# Patient Record
Sex: Female | Born: 1960
Health system: Southern US, Community
[De-identification: ages and names within clinical notes are randomized; demographics above are authoritative.]

## PROBLEM LIST (undated history)

## (undated) DIAGNOSIS — S149XXA Injury of unspecified nerves of neck, initial encounter: Secondary | ICD-10-CM

## (undated) DIAGNOSIS — M7989 Other specified soft tissue disorders: Secondary | ICD-10-CM

## (undated) DIAGNOSIS — I341 Nonrheumatic mitral (valve) prolapse: Secondary | ICD-10-CM

## (undated) DIAGNOSIS — Z789 Other specified health status: Secondary | ICD-10-CM

## (undated) DIAGNOSIS — D649 Anemia, unspecified: Secondary | ICD-10-CM

## (undated) DIAGNOSIS — G589 Mononeuropathy, unspecified: Secondary | ICD-10-CM

## (undated) HISTORY — PX: PAROTID GLAND TUMOR EXCISION: SHX5221

## (undated) HISTORY — PX: UPPER GI ENDOSCOPY: SHX6162

## (undated) HISTORY — PX: CERVICAL POLYPECTOMY: SHX88

## (undated) HISTORY — PX: COLONOSCOPY: SHX174

## (undated) HISTORY — PX: WISDOM TOOTH EXTRACTION: SHX21

---

## 1998-08-06 ENCOUNTER — Ambulatory Visit (HOSPITAL_COMMUNITY): Admission: RE | Admit: 1998-08-06 | Discharge: 1998-08-06 | Payer: Self-pay | Admitting: Internal Medicine

## 1999-06-22 ENCOUNTER — Other Ambulatory Visit: Admission: RE | Admit: 1999-06-22 | Discharge: 1999-06-22 | Payer: Self-pay | Admitting: Obstetrics & Gynecology

## 2000-08-11 ENCOUNTER — Other Ambulatory Visit: Admission: RE | Admit: 2000-08-11 | Discharge: 2000-08-11 | Payer: Self-pay | Admitting: Obstetrics and Gynecology

## 2001-08-14 ENCOUNTER — Other Ambulatory Visit: Admission: RE | Admit: 2001-08-14 | Discharge: 2001-08-14 | Payer: Self-pay | Admitting: Obstetrics and Gynecology

## 2001-11-06 ENCOUNTER — Encounter: Admission: RE | Admit: 2001-11-06 | Discharge: 2001-11-06 | Payer: Self-pay | Admitting: Otolaryngology

## 2001-11-06 ENCOUNTER — Encounter: Payer: Self-pay | Admitting: Otolaryngology

## 2002-01-15 ENCOUNTER — Ambulatory Visit (HOSPITAL_BASED_OUTPATIENT_CLINIC_OR_DEPARTMENT_OTHER): Admission: RE | Admit: 2002-01-15 | Discharge: 2002-01-16 | Payer: Self-pay | Admitting: Otolaryngology

## 2002-01-15 ENCOUNTER — Encounter (INDEPENDENT_AMBULATORY_CARE_PROVIDER_SITE_OTHER): Payer: Self-pay | Admitting: *Deleted

## 2002-09-14 ENCOUNTER — Other Ambulatory Visit: Admission: RE | Admit: 2002-09-14 | Discharge: 2002-09-14 | Payer: Self-pay | Admitting: Obstetrics and Gynecology

## 2002-09-24 ENCOUNTER — Ambulatory Visit (HOSPITAL_COMMUNITY): Admission: RE | Admit: 2002-09-24 | Discharge: 2002-09-24 | Payer: Self-pay | Admitting: Obstetrics and Gynecology

## 2002-09-24 ENCOUNTER — Encounter: Payer: Self-pay | Admitting: Obstetrics and Gynecology

## 2003-10-03 ENCOUNTER — Other Ambulatory Visit: Admission: RE | Admit: 2003-10-03 | Discharge: 2003-10-03 | Payer: Self-pay | Admitting: Obstetrics and Gynecology

## 2003-10-16 ENCOUNTER — Ambulatory Visit (HOSPITAL_COMMUNITY): Admission: RE | Admit: 2003-10-16 | Discharge: 2003-10-16 | Payer: Self-pay | Admitting: Obstetrics and Gynecology

## 2004-03-21 ENCOUNTER — Emergency Department (HOSPITAL_COMMUNITY): Admission: EM | Admit: 2004-03-21 | Discharge: 2004-03-22 | Payer: Self-pay | Admitting: Emergency Medicine

## 2004-10-05 ENCOUNTER — Other Ambulatory Visit: Admission: RE | Admit: 2004-10-05 | Discharge: 2004-10-05 | Payer: Self-pay | Admitting: Obstetrics and Gynecology

## 2004-10-16 ENCOUNTER — Ambulatory Visit (HOSPITAL_COMMUNITY): Admission: RE | Admit: 2004-10-16 | Discharge: 2004-10-16 | Payer: Self-pay | Admitting: Family Medicine

## 2005-07-16 ENCOUNTER — Encounter: Admission: RE | Admit: 2005-07-16 | Discharge: 2005-07-16 | Payer: Self-pay | Admitting: Family Medicine

## 2005-10-12 ENCOUNTER — Other Ambulatory Visit: Admission: RE | Admit: 2005-10-12 | Discharge: 2005-10-12 | Payer: Self-pay | Admitting: Obstetrics and Gynecology

## 2005-10-26 ENCOUNTER — Ambulatory Visit (HOSPITAL_COMMUNITY): Admission: RE | Admit: 2005-10-26 | Discharge: 2005-10-26 | Payer: Self-pay | Admitting: Family Medicine

## 2006-02-11 ENCOUNTER — Encounter: Admission: RE | Admit: 2006-02-11 | Discharge: 2006-02-11 | Payer: Self-pay | Admitting: Family Medicine

## 2006-11-01 ENCOUNTER — Ambulatory Visit (HOSPITAL_COMMUNITY): Admission: RE | Admit: 2006-11-01 | Discharge: 2006-11-01 | Payer: Self-pay | Admitting: Family Medicine

## 2007-11-22 ENCOUNTER — Ambulatory Visit (HOSPITAL_COMMUNITY): Admission: RE | Admit: 2007-11-22 | Discharge: 2007-11-22 | Payer: Self-pay | Admitting: Family Medicine

## 2008-02-22 ENCOUNTER — Encounter (INDEPENDENT_AMBULATORY_CARE_PROVIDER_SITE_OTHER): Payer: Self-pay | Admitting: Obstetrics and Gynecology

## 2008-02-22 ENCOUNTER — Ambulatory Visit (HOSPITAL_COMMUNITY): Admission: RE | Admit: 2008-02-22 | Discharge: 2008-02-22 | Payer: Self-pay | Admitting: Obstetrics and Gynecology

## 2008-09-05 ENCOUNTER — Encounter: Payer: Self-pay | Admitting: Family Medicine

## 2008-11-22 ENCOUNTER — Ambulatory Visit (HOSPITAL_COMMUNITY): Admission: RE | Admit: 2008-11-22 | Discharge: 2008-11-22 | Payer: Self-pay | Admitting: Family Medicine

## 2009-07-24 ENCOUNTER — Encounter: Admission: RE | Admit: 2009-07-24 | Discharge: 2009-07-24 | Payer: Self-pay | Admitting: Family Medicine

## 2009-11-07 ENCOUNTER — Emergency Department (HOSPITAL_COMMUNITY): Admission: EM | Admit: 2009-11-07 | Discharge: 2009-11-07 | Payer: Self-pay | Admitting: Family Medicine

## 2009-11-25 ENCOUNTER — Ambulatory Visit (HOSPITAL_COMMUNITY): Admission: RE | Admit: 2009-11-25 | Discharge: 2009-11-25 | Payer: Self-pay | Admitting: *Deleted

## 2010-10-29 ENCOUNTER — Other Ambulatory Visit (HOSPITAL_COMMUNITY): Payer: Self-pay | Admitting: Obstetrics and Gynecology

## 2010-10-29 DIAGNOSIS — Z1231 Encounter for screening mammogram for malignant neoplasm of breast: Secondary | ICD-10-CM

## 2010-11-30 ENCOUNTER — Ambulatory Visit (HOSPITAL_COMMUNITY)
Admission: RE | Admit: 2010-11-30 | Discharge: 2010-11-30 | Disposition: A | Discharge status: Home / Self Care | Payer: Federal, State, Local not specified - PPO | Source: Ambulatory Visit | Attending: Obstetrics and Gynecology | Admitting: Obstetrics and Gynecology

## 2010-11-30 DIAGNOSIS — Z1231 Encounter for screening mammogram for malignant neoplasm of breast: Secondary | ICD-10-CM | POA: Insufficient documentation

## 2010-12-01 NOTE — Op Note (Signed)
Brenda Ball, Brenda Ball                ACCOUNT NO.:  [id number]   MEDICAL RECORD NO.:  [id number]          PATIENT TYPE:  AMB   LOCATION:  SDC                           FACILITY:  [facility]   PHYSICIAN:  Naima A. Dillard, M.D. DATE OF BIRTH:  Nov 30, 1960   DATE OF PROCEDURE:  02/22/2008  DATE OF DISCHARGE:                               OPERATIVE REPORT   PREOPERATIVE DIAGNOSIS:  Endometrial polyp with endometrial cells on  Pap.   POSTOPERATIVE DIAGNOSIS:  Endometrial cells on Pap.   PROCEDURE:  1. Dilation and curettage.  2. Hysteroscopy.   SURGEON:  Naima A. Dillard, MD.   ASSISTANT:  There are no assistants.   ANESTHESIA:  General laryngeal mask airway and local.   FINDINGS:  Abundant endometrium.  No polyps seen.   SPECIMENS:  Endometrial curettings to pathology.   ESTIMATED BLOOD LOSS:  Minimal.   SORBITOL DEFICIT:  25 mL.   COMPLICATION:  None.   The patient went to PACU in stable condition.   PROCEDURE IN DETAIL:  The patient was taken to the operating room where  she was given anesthesia, placed in dorsal lithotomy position, and  prepped and draped in a normal sterile fashion.  Her bladder was drained  with a straight catheter.  A bivalve speculum was placed into the  vagina.  The anterior lip of the cervix was grasped with single-tooth  tenaculum.  A 20 mL of 1% lidocaine was used for cervical block.  The  cervix was dilated with Pratt dilators up to 21.  A hysteroscope was  placed into the uterine cavity.  Both ostia were visualized and  endocervical canal and endometrial canal, there were no polyps or  submucosal fibroids.  I then removed the hysteroscope and did a sharp  curettage.  Endometrial curettings were sent to pathology.  I looked  again and again both ostia visualized.  There were no submucosal fibroids or polyps noted.  All instruments were  removed from the vagina.  The tenaculum site was made hemostatic with  silver nitrate.  Sponge, lap, and needle  counts were correct.  The  patient was taken to the recovery room in stable condition.      Naima A. Normand Sloop, M.D.  Electronically Signed     NAD/MEDQ  D:  02/22/2008  T:  02/23/2008  Job:  [id number]

## 2010-12-01 NOTE — H&P (Signed)
Brenda Ball, Brenda Ball                ACCOUNT NO.:  [id number]   MEDICAL RECORD NO.:  [id number]          PATIENT TYPE:  AMB   LOCATION:  SDC                           FACILITY:  [facility]   PHYSICIAN:  Naima A. Dillard, M.D. DATE OF BIRTH:  1960/09/10   DATE OF ADMISSION:  DATE OF DISCHARGE:                              HISTORY & PHYSICAL   Patient is a 50 year old female who was found to have endometrial cells  on her Pap smear.  She then came in for an endometrial biopsy, which was  benign.  She had an ultrasound.  The uterus measures 7.87 x 6 x 4 with  normal ovaries.  On sonohystogram, a polyp that was noted just superior  to the cervix, measuring 0.9 cm.  The patient desires for the polyp to  be removed.   PAST MEDICAL HISTORY:  As above.   PAST OB HISTORY:  Significant for C-section x1 and an SAB x1.  No D&E  was done.   PAST GYN HISTORY:  Patient has menses every 28 days, lasting for 4-5  days.  No history of abnormal Pap smear.   PAST SURGICAL HISTORY:  Significant for a parathyroid tumor removed.  C-  section x1.   Patient has no known drug allergies.   She is currently not taking any medications.   PHYSICAL EXAMINATION:  Blood pressure 100/70.  Weight 138 pounds.  She  is 5 foot 4.  Pulse is 68.  Pupils are equal.  Hearing is normal.  Thyroid is not enlarged.  HEART:  Regular rate and rhythm.  CHEST:  Clear to auscultation bilaterally.  BREASTS:  No masses, discharge, skin changes, nipple retraction.  BACK:  No CVA tenderness bilaterally.  ABDOMEN:  Nontender without any masses or organomegaly.  EXTREMITIES:  No clubbing, cyanosis or edema.  NEURO:  Within normal limits.  Vulvovaginal exam is within normal limits.  Cervix is nontender without  any lesions.  Uterus is normal in shape, size, and consistency.  Adnexa:  No masses.  Nontender.   Patient has endometrial cells on Pap with endometrial polyp.  Plan is a  D&C, hysteroscopy, polypectomy.      Naima A.  Normand Sloop, M.D.  Electronically Signed    NAD/MEDQ  D:  02/21/2008  T:  02/21/2008  Job:  [id number]

## 2010-12-02 ENCOUNTER — Other Ambulatory Visit: Payer: Self-pay | Admitting: Obstetrics and Gynecology

## 2010-12-02 DIAGNOSIS — R928 Other abnormal and inconclusive findings on diagnostic imaging of breast: Secondary | ICD-10-CM

## 2010-12-04 NOTE — Op Note (Signed)
[facility]  Patient:    Brenda Ball, Brenda Ball Visit Number: [id number] MRN: [id number]          Service Type: DSU Location: [facility] Attending Physician:  Susy Frizzle Dictated by:   Jeannett Senior Pollyann Kennedy, M.D. Proc. Date: 01/15/02 Admit Date:  01/15/2002   CC:         Duncan Dull, M.D.   Operative Report  PREOPERATIVE DIAGNOSIS:  Recurrent parotid neoplasm.  POSTOPERATIVE DIAGNOSIS:  Recurrent parotid neoplasm.  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  PROCEDURE:  Right revision partial parotidectomy without facial nerve dissection.  SURGEON:  Jefry H. Pollyann Kennedy, M.D.  ASSISTANT:  Kathy Breach, M.D.  COMPLICATIONS:  None.  FINDINGS:  A nodular-type mass involving the area of the tail of the parotid gland.  REFERRING PHYSICIAN:  Duncan Dull, M.D.  CONDITION:  The patient tolerated the procedure, was awakened, extubated, and transferred to the recovery room in stable condition.  INDICATIONS:  This is a 50 year old lady who underwent some sort of parotid gland [place] about 20 [place].  Presumably this was for benign disease.  Over the past couple of years she has noticed a small growth slowly growing in the right side of her neck.  Risks, benefits, alternatives, and complications of the procedure were explained to the patient who seemed to understand and agreed to surgery.  DESCRIPTION OF PROCEDURE:  The patient was taken to the operating room and placed on the operating table in the supine position.  Following induction of general endotracheal anesthesia, the table was turned, the patient was positioned, and prepped and draped in the usual sterile fashion.  A NIM facial nerve monitor was set up with the electrodes in proper positioning.  The previous parotidectomy incision was identified.  The face was prepped and draped in the usual sterile fashion.  The mass was approximately 2 cm in diameter and  was located overlying the upper sternocleidomastoid muscle anterior border in the lower 1/3 of the parotidectomy incision.  This area was outlined with a marking pen including part of the scar and a thin ellipse of skin overlying the mass.  A 15 scalpel was used to incise the skin and through the subcutaneous tissue.  Careful combination of sharp dissection and electrocautery dissection was used to dissect through the deeper layers.  The great auricular, at least the proximal end, was identified and followed up superiorly to where it seemed to either end or perhaps was cut previously. The tumor was just anterior to this.  Careful dissection using a combination of sharp dissection with scissors and blunt dissection with hemostat and bipolar cautery was used to resect a cuff of tissue surrounding the palpable mass.  In several areas on the anterior part of the dissection, there was some jumping of the buccal branch of the facial nerve, but there was no other stimulation with the monitor or stimulation seen with movement of the face. The lesion was removed in its entirety and sent for pathologic evaluation with frozen section.  The wound was irrigated with saline.  It appeared that the majority of the gland was still in place.  There was a clean plane of dissection between the gland and the upper sternocleidomastoid muscle.  The mastoid process and the main trunk of the facial nerve area were not explored. There was dense scar tissue in the anterior aspect of the operative field. The wound was irrigated with saline and hemostasis was achieved  using bipolar cautery and the wound was closed in layers using 4-0 chromic on the deep layer and running 5-0 nylon on the skin.  A TLS drain was left in the wound and exited through the lower aspect of the incision and secured in place with a nylon suture.  The patient was awakened from anesthesia, extubated, and transferred to the recovery room in stable  condition. Dictated by:   Jeannett Senior Pollyann Kennedy, M.D. Attending Physician:  Susy Frizzle DD:  01/15/02 TD:  01/16/02 Job: [id number]

## 2010-12-10 ENCOUNTER — Ambulatory Visit
Admission: RE | Admit: 2010-12-10 | Discharge: 2010-12-10 | Disposition: A | Discharge status: Home / Self Care | Payer: Federal, State, Local not specified - PPO | Source: Ambulatory Visit | Attending: Obstetrics and Gynecology | Admitting: Obstetrics and Gynecology

## 2010-12-10 DIAGNOSIS — R928 Other abnormal and inconclusive findings on diagnostic imaging of breast: Secondary | ICD-10-CM

## 2011-04-16 LAB — CBC
Hemoglobin: 14
MCV: 92.1

## 2011-07-19 ENCOUNTER — Encounter (HOSPITAL_BASED_OUTPATIENT_CLINIC_OR_DEPARTMENT_OTHER): Payer: Self-pay | Admitting: *Deleted

## 2011-07-19 NOTE — Progress Notes (Signed)
No meds-had rt parotid here 03

## 2011-07-23 NOTE — H&P (Addendum)
  Brenda Ball is an 51 y.o. female.   Chief Complaint:  left parotid mass HPI: history of benign right parotid mass removed about 10-20 years ago. Recent mass on the left.  Past Medical History  Diagnosis Date  . No pertinent past medical history     Past Surgical History  Procedure Date  . Parotid gland tumor excision     x2 on rt-last 2003  . Cesarean section   . Cervical polypectomy     History reviewed. No pertinent family history. Social History:  reports that she has never smoked. She does not have any smokeless tobacco history on file. She reports that she does not use illicit drugs. Her alcohol history not on file.  Allergies: No Known Allergies  No current facility-administered medications on file as of .   Medications Prior to Admission  Medication Sig Dispense Refill  . Calcium Carbonate-Vitamin D (CALCIUM-VITAMIN D) 500-200 MG-UNIT per tablet Take 1 tablet by mouth 2 (two) times daily with a meal.        . Multiple Vitamin (MULTIVITAMIN) capsule Take 1 capsule by mouth daily.          No results found for this or any previous visit (from the past 48 hour(s)). No results found.  ROS: otherwise negative  Height 5\' 5"  (1.651 m), weight 63.504 kg (140 lb), last menstrual period 11/28/2010.  PHYSICAL EXAM: Overall appearance:  Healthy appearing, in no distress Head:  Normocephalic, atraumatic. Ears: External auditory canals are clear; tympanic membranes are intact and the middle ears are free of any effusion. Nose: External nose is healthy in appearance. Internal nasal exam free of any lesions or obstruction. Oral Cavity:  There are no mucosal lesions or masses identified. Oral Pharynx/Hypopharynx/Larynx: no signs of any mucosal lesions or masses identified. Vocal cords move normally. Neuro:  No identifiable neurologic deficits. Neck: 2 cm mass left parotid tail. Well-healed incision on the right side.  Studies Reviewed: none    Assessment/Plan Recommend  left side a superficial parotidectomy. Risks and benefits of surgery discussed.  Clydette Privitera H 07/23/2011, 12:25 PM   The patient has been re-examined.  The patient's history and physical has been reviewed and is unchanged.  There is no change in the plan of care.   Serena Colonel, MD

## 2011-07-26 ENCOUNTER — Encounter (HOSPITAL_BASED_OUTPATIENT_CLINIC_OR_DEPARTMENT_OTHER): Payer: Self-pay | Admitting: Anesthesiology

## 2011-07-26 ENCOUNTER — Ambulatory Visit (HOSPITAL_BASED_OUTPATIENT_CLINIC_OR_DEPARTMENT_OTHER)
Admission: RE | Admit: 2011-07-26 | Discharge: 2011-07-27 | Disposition: A | Discharge status: Home / Self Care | Payer: Federal, State, Local not specified - PPO | Source: Ambulatory Visit | Attending: Otolaryngology | Admitting: Otolaryngology

## 2011-07-26 ENCOUNTER — Encounter (HOSPITAL_BASED_OUTPATIENT_CLINIC_OR_DEPARTMENT_OTHER): Payer: Self-pay | Admitting: *Deleted

## 2011-07-26 ENCOUNTER — Other Ambulatory Visit: Payer: Self-pay | Admitting: Otolaryngology

## 2011-07-26 ENCOUNTER — Encounter (HOSPITAL_BASED_OUTPATIENT_CLINIC_OR_DEPARTMENT_OTHER)
Admission: RE | Disposition: A | Discharge status: Home / Self Care | Payer: Self-pay | Source: Ambulatory Visit | Attending: Otolaryngology

## 2011-07-26 ENCOUNTER — Ambulatory Visit (HOSPITAL_BASED_OUTPATIENT_CLINIC_OR_DEPARTMENT_OTHER): Payer: Federal, State, Local not specified - PPO | Admitting: Anesthesiology

## 2011-07-26 DIAGNOSIS — D490 Neoplasm of unspecified behavior of digestive system: Secondary | ICD-10-CM

## 2011-07-26 DIAGNOSIS — D119 Benign neoplasm of major salivary gland, unspecified: Secondary | ICD-10-CM | POA: Insufficient documentation

## 2011-07-26 HISTORY — PX: PAROTIDECTOMY: SHX2163

## 2011-07-26 HISTORY — DX: Other specified health status: Z78.9

## 2011-07-26 SURGERY — EXCISION, PAROTID GLAND
Anesthesia: General | Site: Neck | Laterality: Left | Wound class: Clean

## 2011-07-26 MED ORDER — SUCCINYLCHOLINE CHLORIDE 20 MG/ML IJ SOLN
INTRAMUSCULAR | Status: DC | PRN
  Administered 2011-07-26: 100 mg via INTRAVENOUS

## 2011-07-26 MED ORDER — ACETAMINOPHEN 650 MG RE SUPP: 650.0000 mg | RECTAL | Status: DC | PRN

## 2011-07-26 MED ORDER — HYDROCODONE-ACETAMINOPHEN 5-325 MG PO TABS: 1.0000 | ORAL_TABLET | ORAL | Status: DC | PRN

## 2011-07-26 MED ORDER — METOCLOPRAMIDE HCL 5 MG/ML IJ SOLN: 10.0000 mg | Freq: Once | INTRAMUSCULAR | Status: AC | PRN

## 2011-07-26 MED ORDER — CEFAZOLIN SODIUM 1-5 GM-% IV SOLN
1.0000 g | INTRAVENOUS | Status: AC
  Administered 2011-07-26: 1 g via INTRAVENOUS

## 2011-07-26 MED ORDER — DEXTROSE-NACL 5-0.9 % IV SOLN: INTRAVENOUS | Status: DC

## 2011-07-26 MED ORDER — MIDAZOLAM HCL 5 MG/5ML IJ SOLN
INTRAMUSCULAR | Status: DC | PRN
  Administered 2011-07-26: 2 mg via INTRAVENOUS

## 2011-07-26 MED ORDER — FENTANYL CITRATE 0.05 MG/ML IJ SOLN: 50.0000 ug | INTRAMUSCULAR | Status: DC | PRN

## 2011-07-26 MED ORDER — IBUPROFEN 100 MG/5ML PO SUSP: 400.0000 mg | Freq: Four times a day (QID) | ORAL | Status: DC | PRN

## 2011-07-26 MED ORDER — MIDAZOLAM HCL 2 MG/2ML IJ SOLN: 0.5000 mg | INTRAMUSCULAR | Status: DC | PRN

## 2011-07-26 MED ORDER — PROMETHAZINE HCL 25 MG PO TABS: 25.0000 mg | ORAL_TABLET | Freq: Four times a day (QID) | ORAL | Status: DC | PRN

## 2011-07-26 MED ORDER — LIDOCAINE HCL (CARDIAC) 20 MG/ML IV SOLN
INTRAVENOUS | Status: DC | PRN
  Administered 2011-07-26: 100 mg via INTRAVENOUS

## 2011-07-26 MED ORDER — DROPERIDOL 2.5 MG/ML IJ SOLN
INTRAMUSCULAR | Status: DC | PRN
  Administered 2011-07-26: 0.625 mg via INTRAVENOUS

## 2011-07-26 MED ORDER — LIDOCAINE-EPINEPHRINE 1 %-1:100000 IJ SOLN
INTRAMUSCULAR | Status: DC | PRN
  Administered 2011-07-26: 2.5 mL

## 2011-07-26 MED ORDER — LACTATED RINGERS IV SOLN
INTRAVENOUS | Status: DC
  Administered 2011-07-26 (×2): via INTRAVENOUS

## 2011-07-26 MED ORDER — MORPHINE SULFATE 2 MG/ML IJ SOLN: 0.0500 mg/kg | INTRAMUSCULAR | Status: DC | PRN

## 2011-07-26 MED ORDER — ACETAMINOPHEN 160 MG/5ML PO SOLN: 650.0000 mg | ORAL | Status: DC | PRN

## 2011-07-26 MED ORDER — FENTANYL CITRATE 0.05 MG/ML IJ SOLN
INTRAMUSCULAR | Status: DC | PRN
  Administered 2011-07-26: 100 ug via INTRAVENOUS
  Administered 2011-07-26 (×2): 25 ug via INTRAVENOUS

## 2011-07-26 MED ORDER — FENTANYL CITRATE 0.05 MG/ML IJ SOLN: 25.0000 ug | INTRAMUSCULAR | Status: DC | PRN

## 2011-07-26 MED ORDER — DEXTROSE-NACL 5-0.45 % IV SOLN
INTRAVENOUS | Status: DC
  Administered 2011-07-26: 11:00:00 via INTRAVENOUS

## 2011-07-26 MED ORDER — CALCIUM-VITAMIN D 500-200 MG-UNIT PO TABS
1.0000 | ORAL_TABLET | Freq: Two times a day (BID) | ORAL | Status: DC
  Administered 2011-07-26: 1 via ORAL

## 2011-07-26 MED ORDER — ACETAMINOPHEN 10 MG/ML IV SOLN
1000.0000 mg | Freq: Once | INTRAVENOUS | Status: AC
  Administered 2011-07-26: 1000 mg via INTRAVENOUS

## 2011-07-26 MED ORDER — PROMETHAZINE HCL 25 MG RE SUPP: 25.0000 mg | Freq: Four times a day (QID) | RECTAL | Status: DC | PRN

## 2011-07-26 MED ORDER — PHENYLEPHRINE HCL 10 MG/ML IJ SOLN
INTRAMUSCULAR | Status: DC | PRN
  Administered 2011-07-26 (×2): 40 ug via INTRAVENOUS

## 2011-07-26 MED ORDER — PROPOFOL 10 MG/ML IV EMUL
INTRAVENOUS | Status: DC | PRN
  Administered 2011-07-26: 200 mg via INTRAVENOUS

## 2011-07-26 MED ORDER — MULTIVITAMINS PO CAPS: 1.0000 | ORAL_CAPSULE | Freq: Every day | ORAL | Status: DC

## 2011-07-26 MED ORDER — ONDANSETRON HCL 4 MG/2ML IJ SOLN
INTRAMUSCULAR | Status: DC | PRN
  Administered 2011-07-26: 4 mg via INTRAVENOUS

## 2011-07-26 MED ORDER — DEXAMETHASONE SODIUM PHOSPHATE 4 MG/ML IJ SOLN
INTRAMUSCULAR | Status: DC | PRN
  Administered 2011-07-26: 10 mg via INTRAVENOUS

## 2011-07-26 SURGICAL SUPPLY — 67 items
ADH SKN CLS APL DERMABOND .7 (GAUZE/BANDAGES/DRESSINGS) ×2
APL SKNCLS STERI-STRIP NONHPOA (GAUZE/BANDAGES/DRESSINGS) ×1
ATTRACTOMAT 16X20 MAGNETIC DRP (DRAPES) ×1 IMPLANT
BENZOIN TINCTURE PRP APPL 2/3 (GAUZE/BANDAGES/DRESSINGS) ×1 IMPLANT
BLADE SURG 15 STRL LF DISP TIS (BLADE) ×1 IMPLANT
BLADE SURG 15 STRL SS (BLADE) ×2
CANISTER SUCTION 1200CC (MISCELLANEOUS) ×2 IMPLANT
CLEANER CAUTERY TIP 5X5 PAD (MISCELLANEOUS) ×1 IMPLANT
CLOTH BEACON ORANGE TIMEOUT ST (SAFETY) ×2 IMPLANT
CORDS BIPOLAR (ELECTRODE) ×2 IMPLANT
COVER MAYO STAND STRL (DRAPES) ×2 IMPLANT
COVER TABLE BACK 60X90 (DRAPES) ×2 IMPLANT
DERMABOND ADVANCED (GAUZE/BANDAGES/DRESSINGS) ×2
DERMABOND ADVANCED .7 DNX12 (GAUZE/BANDAGES/DRESSINGS) IMPLANT
DRAIN JACKSON RD 7FR 3/32 (WOUND CARE) ×1 IMPLANT
DRAIN TLS ROUND 10FR (DRAIN) IMPLANT
DRAPE INCISE 23X17 IOBAN STRL (DRAPES) ×1
DRAPE INCISE 23X17 STRL (DRAPES) ×1 IMPLANT
DRAPE INCISE IOBAN 23X17 STRL (DRAPES) ×1 IMPLANT
DRAPE U-SHAPE 76X120 STRL (DRAPES) ×2 IMPLANT
ELECT COATED BLADE 2.86 ST (ELECTRODE) ×2 IMPLANT
ELECT REM PT RETURN 9FT ADLT (ELECTROSURGICAL) ×2
ELECTRODE REM PT RTRN 9FT ADLT (ELECTROSURGICAL) ×1 IMPLANT
EVACUATOR SILICONE 100CC (DRAIN) ×1 IMPLANT
GAUZE SPONGE 4X4 12PLY STRL LF (GAUZE/BANDAGES/DRESSINGS) ×1 IMPLANT
GAUZE SPONGE 4X4 16PLY XRAY LF (GAUZE/BANDAGES/DRESSINGS) ×1 IMPLANT
GLOVE BIO SURGEON STRL SZ 6.5 (GLOVE) ×2 IMPLANT
GLOVE BIO SURGEON STRL SZ7 (GLOVE) IMPLANT
GLOVE BIO SURGEON STRL SZ7.5 (GLOVE) IMPLANT
GLOVE BIOGEL PI IND STRL 8 (GLOVE) IMPLANT
GLOVE BIOGEL PI INDICATOR 8 (GLOVE)
GLOVE ECLIPSE 6.5 STRL STRAW (GLOVE) ×1 IMPLANT
GLOVE ECLIPSE 7.5 STRL STRAW (GLOVE) ×2 IMPLANT
GLOVE ECLIPSE 8.0 STRL XLNG CF (GLOVE) IMPLANT
GLOVE SS BIOGEL STRL SZ 7.5 (GLOVE) IMPLANT
GLOVE SUPERSENSE BIOGEL SZ 7.5 (GLOVE) ×1
GOWN PREVENTION PLUS XLARGE (GOWN DISPOSABLE) ×2 IMPLANT
GOWN PREVENTION PLUS XXLARGE (GOWN DISPOSABLE) ×1 IMPLANT
LOCATOR NERVE 3 VOLT (DISPOSABLE) ×1 IMPLANT
NDL HYPO 25X1 1.5 SAFETY (NEEDLE) IMPLANT
NEEDLE 27GAX1X1/2 (NEEDLE) ×1 IMPLANT
NEEDLE HYPO 25X1 1.5 SAFETY (NEEDLE) IMPLANT
NS IRRIG 1000ML POUR BTL (IV SOLUTION) ×2 IMPLANT
PACK BASIN DAY SURGERY FS (CUSTOM PROCEDURE TRAY) ×2 IMPLANT
PAD CLEANER CAUTERY TIP 5X5 (MISCELLANEOUS) ×1
PENCIL FOOT CONTROL (ELECTRODE) ×2 IMPLANT
PIN SAFETY STERILE (MISCELLANEOUS) ×1 IMPLANT
SHEARS HARMONIC 9CM CVD (BLADE) ×2 IMPLANT
SHEET MEDIUM DRAPE 40X70 STRL (DRAPES) IMPLANT
SLEEVE SCD COMPRESS KNEE MED (MISCELLANEOUS) ×1 IMPLANT
STAPLER VISISTAT 35W (STAPLE) IMPLANT
STRIP CLOSURE SKIN 1/2X4 (GAUZE/BANDAGES/DRESSINGS) ×1 IMPLANT
SUCTION FRAZIER TIP 10 FR DISP (SUCTIONS) IMPLANT
SUT CHROMIC 3 0 PS 2 (SUTURE) ×2 IMPLANT
SUT CHROMIC 4 0 PS 2 18 (SUTURE) ×1 IMPLANT
SUT ETHILON 3 0 PS 1 (SUTURE) ×2 IMPLANT
SUT ETHILON 5 0 P 3 18 (SUTURE)
SUT NYLON ETHILON 5-0 P-3 1X18 (SUTURE) IMPLANT
SUT PLAIN 5 0 P 3 18 (SUTURE) IMPLANT
SUT SILK 3 0 PS 1 (SUTURE) ×2 IMPLANT
SUT SILK 3 0 SH CR/8 (SUTURE) IMPLANT
SUT SILK 4 0 TIES 17X18 (SUTURE) ×2 IMPLANT
SYR BULB 3OZ (MISCELLANEOUS) ×2 IMPLANT
SYR CONTROL 10ML LL (SYRINGE) ×1 IMPLANT
TOWEL OR 17X24 6PK STRL BLUE (TOWEL DISPOSABLE) ×4 IMPLANT
TUBE CONNECTING 20X1/4 (TUBING) ×2 IMPLANT
WATER STERILE IRR 1000ML POUR (IV SOLUTION) ×1 IMPLANT

## 2011-07-26 NOTE — Anesthesia Postprocedure Evaluation (Signed)
Anesthesia Post Note  Patient: Brenda Ball  Procedure(s) Performed:  PAROTIDECTOMY  Anesthesia type: General  Patient location: PACU  Post pain: Pain level controlled  Post assessment: Patient's Cardiovascular Status Stable  Last Vitals:  Filed Vitals:   07/26/11 1023  BP: 143/84  Pulse: 72  Temp: 36.8 C  Resp: 18    Post vital signs: Reviewed and stable  Level of consciousness: alert  Complications: No apparent anesthesia complications

## 2011-07-26 NOTE — Progress Notes (Signed)
   ENT Progress Note:  s/p Procedure(s): PAROTIDECTOMY   Subjective: Patient s/p L parotidectomy  Objective: Vital signs in last 24 hours: Temp:  [97.6 F (36.4 C)-98.2 F (36.8 C)] 97.9 F (36.6 C) (01/07 1600) Pulse Rate:  [71-82] 75  (01/07 1600) Resp:  [13-27] 18  (01/07 1600) BP: (123-143)/(61-93) 125/61 mmHg (01/07 1600) SpO2:  [98 %-100 %] 99 % (01/07 1600) Weight change:     Intake/Output from previous day:   Intake/Output this shift: Total I/O In: 2130 [P.O.:630; I.V.:1500] Out: 1635 [Urine:1600; Drains:35]  PHYSICAL EXAM: Inc intact, no swelling or erythema L Facial nerve weakness all branches  JP 25cc   Assessment/Plan: Pt stable, FN weakness O/N Obs    Brenda Ball L 07/26/2011, 5:13 PM

## 2011-07-26 NOTE — Anesthesia Procedure Notes (Addendum)
Procedure Name: Intubation Date/Time: 07/26/2011 7:42 AM Performed by: Zenia Resides D Pre-anesthesia Checklist: Patient identified, Emergency Drugs available, Suction available and Patient being monitored Patient Re-evaluated:Patient Re-evaluated prior to inductionOxygen Delivery Method: Circle System Utilized Preoxygenation: Pre-oxygenation with 100% oxygen Intubation Type: IV induction Ventilation: Mask ventilation without difficulty Laryngoscope Size: Mac and 3 Grade View: Grade I Tube type: Oral Tube size: 7.0 mm Number of attempts: 1 Airway Equipment and Method: stylet and oral airway Placement Confirmation: ETT inserted through vocal cords under direct vision,  positive ETCO2 and breath sounds checked- equal and bilateral Tube secured with: Tape Dental Injury: Teeth and Oropharynx as per pre-operative assessment

## 2011-07-26 NOTE — Anesthesia Preprocedure Evaluation (Signed)
Anesthesia Evaluation  Patient identified by MRN, date of birth, ID band Patient awake    Reviewed: Allergy & Precautions, H&P , NPO status , Patient's Chart, lab work & pertinent test results, reviewed documented beta blocker date and time   Airway Mallampati: II TM Distance: >3 FB Neck ROM: full    Dental   Pulmonary neg pulmonary ROS,          Cardiovascular neg cardio ROS     Neuro/Psych Negative Neurological ROS  Negative Psych ROS   GI/Hepatic negative GI ROS, Neg liver ROS,   Endo/Other  Negative Endocrine ROS  Renal/GU negative Renal ROS  Genitourinary negative   Musculoskeletal   Abdominal   Peds  Hematology negative hematology ROS (+)   Anesthesia Other Findings See surgeon's H&P   Reproductive/Obstetrics negative OB ROS                           Anesthesia Physical Anesthesia Plan  ASA: I  Anesthesia Plan: General   Post-op Pain Management:    Induction: Intravenous  Airway Management Planned: Oral ETT  Additional Equipment:   Intra-op Plan:   Post-operative Plan: Extubation in OR  Informed Consent: I have reviewed the patients History and Physical, chart, labs and discussed the procedure including the risks, benefits and alternatives for the proposed anesthesia with the patient or authorized representative who has indicated his/her understanding and acceptance.     Plan Discussed with: CRNA and Surgeon  Anesthesia Plan Comments:         Anesthesia Quick Evaluation

## 2011-07-26 NOTE — Transfer of Care (Signed)
Immediate Anesthesia Transfer of Care Note  Patient: Brenda Ball  Procedure(s) Performed:  PAROTIDECTOMY  Patient Location: PACU  Anesthesia Type: General  Level of Consciousness: oriented and lethargic  Airway & Oxygen Therapy: Patient Spontanous Breathing and Patient connected to face mask oxygen  Post-op Assessment: Report given to PACU RN and Post -op Vital signs reviewed and stable  Post vital signs: Reviewed and stable  Complications: No apparent anesthesia complications

## 2011-07-26 NOTE — Op Note (Signed)
OPERATIVE REPORT  DATE OF SURGERY: 07/26/2011  PATIENT:  Webb Silversmith,  51 y.o. female  PRE-OPERATIVE DIAGNOSIS:  left parotid mass  POST-OPERATIVE DIAGNOSIS:  left parotid mass  PROCEDURE:  Procedure(s): PAROTIDECTOMY  SURGEON:  Susy Frizzle, MD  ASSISTANTS: Aquilla Hacker, PA   ANESTHESIA:   general  EBL:  10 ml  DRAINS: (7 [place] round) Jackson-Pratt drain(s) with closed bulb suction in the Parotid bed   LOCAL MEDICATIONS USED:  XYLOCAINE 3CC  SPECIMEN:  Source of Specimen:  Left superficial parotid gland  COUNTS:  YES  PROCEDURE DETAILS: The patient was taken to the operating room and placed on the operating table in the supine position. Following induction of general endotracheal anesthesia the left neck and face was prepped and draped in a standard fashion. A marking pen was used to outline the incision starting in the preauricular crease and continuing around the mastoid tip. 1% Xylocaine with 1:100,000 epinephrine was injected along the incision. A #15 scalpel was used to incise the skin. Electrocautery was used to continue the dissection down towards the sternocleidomastoid muscle. The parotid tail was dissected anteriorly off of the sternocleidomastoid muscle. It was also dissected forward off of the ear canal cartilage and bone. A superficial skin flap was elevated anteriorly exposing sufficient parotid to allow removal of the palpable mass. Dissection continued down between the parotid and the mastoid bone until the posterior belly of the digastric muscle was identified. The main trunk of the facial nerve was then identified. A McCabe dissector was used to continue the dissection along the main trunk to the pes anserinus. The upper and lower division of the nerve were then dissected in a similar fashion. All branches were clearly identified, dissected with the McCabe, and the Harmonic scalpel was used to divide the parotid tissue. After all branches were adequately  exposed and dissected, the specimen was brought forward until the tumor was removed in its entirety. This was sent for pathologic evaluation. The wounds irrigated with saline. The duct was identified and was ligated with a silk suture. The facial nerve was tested with a battery operated facial nerve stimulator. There was excellent movement of the lower division, less so of the upper division. A #7 [place] round J-P drain was left in the wound and exited through the lower part of the incision and secured in place with a nylon suture. The incision was reapproximated in a subcuticular fashion with interrupted 4-0 chromic suture. Dermabond was used on the skin. The drain was charged and held the seal. The patient was then awakened, extubated and transferred to recovery in stable condition.   PATIENT DISPOSITION:  PACU - hemodynamically stable.

## 2011-07-27 ENCOUNTER — Encounter (HOSPITAL_BASED_OUTPATIENT_CLINIC_OR_DEPARTMENT_OTHER): Payer: Self-pay | Admitting: Otolaryngology

## 2011-07-27 NOTE — Discharge Summary (Signed)
Physician Discharge Summary  Patient ID: Brenda Ball MRN: [id number] DOB/AGE: [birth year] 51 y.o.  Admit date: 07/26/2011 Discharge date: 07/27/2011  Admission Diagnoses: Parotid mass  Discharge Diagnoses:  Active Problems:  * No active hospital problems. *    Discharged Condition: good  Hospital Course: Left facial weakness, otherwise no problems.  Consults: none  Significant Diagnostic Studies: none  Treatments: surgery: Left parotidectomy  Discharge Exam: Blood pressure 115/80, pulse 72, temperature 98.2 F (36.8 C), temperature source Oral, resp. rate 16, height 5\' 5"  (1.651 m), weight 63.504 kg (140 lb), last menstrual period 07/25/2010, SpO2 99.00%. PHYSICAL EXAM: Left parotidectomy incision excellent, no swelling. Left facial paralysis.   Disposition: Final discharge disposition not confirmed  Discharge Orders    Future Orders Please Complete By Expires   Diet - low sodium heart healthy      Increase activity slowly      Nursing communication      Scheduling Instructions:   You may get soap and water on the incision but do not use any creams or lotions on it. The glue will flake off in 2-3 weeks.  Use wetting drops in the left eye as needed. Tape the left eye closed at night when sleeping, until it is able to close on its own. Call me for any eye pain or visual changes,.  Call for any swelling of the face or any other concerns.     Current Discharge Medication List    CONTINUE these medications which have NOT CHANGED   Details  Calcium Carbonate-Vitamin D (CALCIUM-VITAMIN D) 500-200 MG-UNIT per tablet Take 1 tablet by mouth 2 (two) times daily with a meal.      Multiple Vitamin (MULTIVITAMIN) capsule Take 1 capsule by mouth daily.         Follow-up Information    Follow up with Yehoshua Vitelli H, MD. Call in 1 week.   Contact information:   [address], [address] [place] [address] [phone]          Signed: Susy Frizzle 07/27/2011, 8:44 AM

## 2011-07-27 NOTE — Progress Notes (Signed)
Doing well, minimal pain. Left face very weak. JP removed. Wound looks excellent. D/C home.

## 2011-11-15 ENCOUNTER — Other Ambulatory Visit: Payer: Self-pay | Admitting: Family Medicine

## 2011-11-15 DIAGNOSIS — Z1231 Encounter for screening mammogram for malignant neoplasm of breast: Secondary | ICD-10-CM

## 2011-11-30 ENCOUNTER — Ambulatory Visit: Payer: Federal, State, Local not specified - PPO | Attending: Neurology

## 2011-11-30 DIAGNOSIS — M6281 Muscle weakness (generalized): Secondary | ICD-10-CM | POA: Insufficient documentation

## 2011-11-30 DIAGNOSIS — IMO0001 Reserved for inherently not codable concepts without codable children: Secondary | ICD-10-CM | POA: Insufficient documentation

## 2011-12-06 ENCOUNTER — Ambulatory Visit: Payer: Federal, State, Local not specified - PPO

## 2011-12-09 ENCOUNTER — Ambulatory Visit: Payer: Federal, State, Local not specified - PPO

## 2011-12-14 ENCOUNTER — Encounter: Payer: Self-pay | Admitting: Obstetrics and Gynecology

## 2011-12-14 ENCOUNTER — Ambulatory Visit (INDEPENDENT_AMBULATORY_CARE_PROVIDER_SITE_OTHER): Payer: Federal, State, Local not specified - PPO | Admitting: Obstetrics and Gynecology

## 2011-12-14 VITALS — BP 102/70 | Resp 16 | Ht 65.0 in | Wt 150.0 lb

## 2011-12-14 DIAGNOSIS — B009 Herpesviral infection, unspecified: Secondary | ICD-10-CM

## 2011-12-14 DIAGNOSIS — Z01419 Encounter for gynecological examination (general) (routine) without abnormal findings: Secondary | ICD-10-CM

## 2011-12-14 DIAGNOSIS — E669 Obesity, unspecified: Secondary | ICD-10-CM | POA: Insufficient documentation

## 2011-12-14 MED ORDER — VALACYCLOVIR HCL 500 MG PO TABS: 500.0000 mg | ORAL_TABLET | Freq: Every day | ORAL | Status: AC

## 2011-12-14 NOTE — Progress Notes (Signed)
Regular Periods: yes Mammogram: 12/10/2010 "WNL"  Monthly Breast Ex.: no Exercise: yes  Tetanus < 10 years: yes Seatbelts: yes  NI. Bladder Functn.: yes Abuse at home: no  Daily BM's: yes Stressful Work: yes  Healthy Diet: yes Sigmoid-Colonoscopy: Per pt 11/2009   Calcium: yes Medical problems this year: per pt None   LAST PAP: 12/10/2010 "WNL"   Contraception: per pt None  Mammogram:  Last Mammogram 12-10-10 "WNL" pt states she is due for mammogram now.  PCP: Dr.Fulp  PMH: per pt No Changes  FMH: per pt No changes  Last Bone Scan: per pt Never.   Subjective:    Brenda Ball is a 51 y.o. female who presents for an annual exam. See above. She has a history of herpes virus but has infrequent outbreaks.  She is currently on her cycle.  Her 23 year old daughter is doing well.  Less stress in the last year.  Prior Hysterectomy: No    History   Social History  . Marital Status: Married    Spouse Name: N/A    Number of Children: N/A  . Years of Education: N/A   Social History Main Topics  . Smoking status: Never Smoker   . Smokeless tobacco: Never Used  . Alcohol Use: No  . Drug Use: No  . Sexually Active: None   Other Topics Concern  . None   Social History Narrative  . None    Menstrual cycle:   LMP: Patient's last menstrual period was 12/13/2010.           Cycle: Regular, monthly with normal flow and no severe dysmenorrha  The following portions of the patient's history were reviewed and updated as appropriate: allergies, current medications, past family history, past medical history, past social history, past surgical history and problem list.  Review of Systems Pertinent items are noted in HPI. Breast:Negative for breast lump,nipple discharge or nipple retraction Gastrointestinal: Negative for abdominal pain, change in bowel habits or rectal bleeding Urinary:negative   Objective:    BP 102/70  Resp 16  Ht 5\' 5"  (1.651 m)  Wt 150 lb (68.04 kg)  BMI  24.96 kg/m2  LMP 12/13/2010    Weight:  Wt Readings from Last 1 Encounters:  12/14/11 150 lb (68.04 kg)          BMI: Body mass index is 24.96 kg/(m^2).  General Appearance: Alert, appropriate appearance for age. No acute distress HEENT: Grossly normal Neck / Thyroid: Supple, no masses, nodes or enlargement Lungs: clear to auscultation bilaterally Back: No CVA tenderness Breast Exam: No masses or nodes.No dimpling, nipple retraction or discharge. Cardiovascular: Regular rate and rhythm. S1, S2, no murmur Gastrointestinal: Soft, non-tender, no masses or organomegaly  ++++++++++++++++++++++++++++++++++++++++++++++++++++++++  Pelvic Exam: External genitalia: normal general appearance Vaginal: normal without tenderness, induration or masses and cystocele and rectocele present, menstrual blood Cervix: normal appearance Adnexa: normal bimanual exam Uterus: normal size shape and consistency Rectovaginal: normal rectal, no masses  ++++++++++++++++++++++++++++++++++++++++++++++++++++++++  Lymphatic Exam: Non-palpable nodes in neck, clavicular, axillary, or inguinal regions Neurologic: Normal speech, no tremor  Psychiatric: Alert and oriented, appropriate affect.   Wet Prep:not applicable Urinalysis:not applicable UPT: Not done   Assessment:    Normal gyn exam   Herpes virus  Overweight or obese: Yes   Pelvic relaxation: Yes  Plan:    Valtrex 500 mg each day  mammogram RTO annually or prn Contraception:abstinence    STD screen request: none  RPR: No.   HBsAg: No.  Hepatitis C:  No.  The updated Pap smear screening guidelines were discussed with the patient. The patient requested that I obtain a Pap smear: Yes.  Kegel exercises discussed: Yes.  Proper diet and regular exercise were reviewed.  Annual mammograms recommended starting at age 61. Proper breast care was discussed.  Screening colonoscopy is recommended beginning at age 67.  Regular health maintenance  was reviewed.  Sleep hygiene was discussed.  Adequate calcium and vitamin D intake was emphasized.  Mylinda Latina.D.

## 2011-12-15 ENCOUNTER — Ambulatory Visit: Payer: Federal, State, Local not specified - PPO | Admitting: Physical Therapy

## 2011-12-16 ENCOUNTER — Telehealth: Payer: Self-pay

## 2011-12-16 NOTE — Telephone Encounter (Signed)
Rx was not sent to pt pharm .Valtrex 500mg  was called into [facility] on [address] per Misty Stanley on 12-16-11. [facility] CMA

## 2011-12-17 ENCOUNTER — Ambulatory Visit: Payer: Federal, State, Local not specified - PPO

## 2011-12-20 ENCOUNTER — Ambulatory Visit
Admission: RE | Admit: 2011-12-20 | Discharge: 2011-12-20 | Disposition: A | Discharge status: Home / Self Care | Payer: Federal, State, Local not specified - PPO | Source: Ambulatory Visit | Attending: Family Medicine | Admitting: Family Medicine

## 2011-12-20 DIAGNOSIS — Z1231 Encounter for screening mammogram for malignant neoplasm of breast: Secondary | ICD-10-CM

## 2011-12-21 ENCOUNTER — Ambulatory Visit: Payer: Federal, State, Local not specified - PPO | Attending: Neurology | Admitting: Physical Therapy

## 2011-12-21 DIAGNOSIS — M6281 Muscle weakness (generalized): Secondary | ICD-10-CM | POA: Insufficient documentation

## 2011-12-21 DIAGNOSIS — IMO0001 Reserved for inherently not codable concepts without codable children: Secondary | ICD-10-CM | POA: Insufficient documentation

## 2011-12-23 ENCOUNTER — Ambulatory Visit: Payer: Federal, State, Local not specified - PPO | Admitting: Physical Therapy

## 2011-12-27 ENCOUNTER — Ambulatory Visit: Payer: Federal, State, Local not specified - PPO

## 2011-12-29 ENCOUNTER — Ambulatory Visit: Payer: Federal, State, Local not specified - PPO

## 2013-01-10 ENCOUNTER — Other Ambulatory Visit: Payer: Self-pay

## 2013-01-10 DIAGNOSIS — Z1231 Encounter for screening mammogram for malignant neoplasm of breast: Secondary | ICD-10-CM

## 2013-01-25 ENCOUNTER — Ambulatory Visit
Admission: RE | Admit: 2013-01-25 | Discharge: 2013-01-25 | Disposition: A | Discharge status: Home / Self Care | Payer: Federal, State, Local not specified - PPO | Source: Ambulatory Visit

## 2013-01-25 DIAGNOSIS — Z1231 Encounter for screening mammogram for malignant neoplasm of breast: Secondary | ICD-10-CM

## 2013-12-18 ENCOUNTER — Other Ambulatory Visit: Payer: Self-pay

## 2013-12-18 DIAGNOSIS — Z1231 Encounter for screening mammogram for malignant neoplasm of breast: Secondary | ICD-10-CM

## 2014-01-28 ENCOUNTER — Encounter (INDEPENDENT_AMBULATORY_CARE_PROVIDER_SITE_OTHER): Payer: Self-pay

## 2014-01-28 ENCOUNTER — Ambulatory Visit
Admission: RE | Admit: 2014-01-28 | Discharge: 2014-01-28 | Disposition: A | Discharge status: Home / Self Care | Payer: Federal, State, Local not specified - PPO | Source: Ambulatory Visit

## 2014-01-28 DIAGNOSIS — Z1231 Encounter for screening mammogram for malignant neoplasm of breast: Secondary | ICD-10-CM

## 2014-05-03 ENCOUNTER — Other Ambulatory Visit: Payer: Self-pay

## 2014-12-13 ENCOUNTER — Other Ambulatory Visit: Payer: Self-pay | Admitting: Obstetrics and Gynecology

## 2014-12-13 DIAGNOSIS — R1900 Intra-abdominal and pelvic swelling, mass and lump, unspecified site: Secondary | ICD-10-CM

## 2014-12-23 ENCOUNTER — Other Ambulatory Visit: Payer: Self-pay

## 2014-12-23 DIAGNOSIS — Z1231 Encounter for screening mammogram for malignant neoplasm of breast: Secondary | ICD-10-CM

## 2014-12-25 ENCOUNTER — Ambulatory Visit
Admission: RE | Admit: 2014-12-25 | Discharge: 2014-12-25 | Disposition: A | Discharge status: Home / Self Care | Payer: Federal, State, Local not specified - PPO | Source: Ambulatory Visit | Attending: Obstetrics and Gynecology | Admitting: Obstetrics and Gynecology

## 2014-12-25 DIAGNOSIS — R1900 Intra-abdominal and pelvic swelling, mass and lump, unspecified site: Secondary | ICD-10-CM

## 2014-12-25 MED ORDER — GADOBENATE DIMEGLUMINE 529 MG/ML IV SOLN
14.0000 mL | Freq: Once | INTRAVENOUS | Status: AC | PRN
  Administered 2014-12-25: 14 mL via INTRAVENOUS

## 2015-03-04 ENCOUNTER — Ambulatory Visit: Payer: Federal, State, Local not specified - PPO

## 2015-03-18 ENCOUNTER — Ambulatory Visit
Admission: RE | Admit: 2015-03-18 | Discharge: 2015-03-18 | Disposition: A | Discharge status: Home / Self Care | Payer: Federal, State, Local not specified - PPO | Source: Ambulatory Visit

## 2015-03-18 DIAGNOSIS — Z1231 Encounter for screening mammogram for malignant neoplasm of breast: Secondary | ICD-10-CM

## 2015-03-20 ENCOUNTER — Other Ambulatory Visit: Payer: Self-pay | Admitting: Obstetrics and Gynecology

## 2015-03-20 DIAGNOSIS — R928 Other abnormal and inconclusive findings on diagnostic imaging of breast: Secondary | ICD-10-CM

## 2015-03-28 ENCOUNTER — Ambulatory Visit
Admission: RE | Admit: 2015-03-28 | Discharge: 2015-03-28 | Disposition: A | Discharge status: Home / Self Care | Payer: Federal, State, Local not specified - PPO | Source: Ambulatory Visit | Attending: Obstetrics and Gynecology | Admitting: Obstetrics and Gynecology

## 2015-03-28 DIAGNOSIS — R928 Other abnormal and inconclusive findings on diagnostic imaging of breast: Secondary | ICD-10-CM

## 2015-10-23 ENCOUNTER — Other Ambulatory Visit: Payer: Self-pay | Admitting: Obstetrics and Gynecology

## 2015-10-27 NOTE — Patient Instructions (Signed)
Your procedure is scheduled on:  Wednesday, November 05, 2015  Enter through the [employer] [facility] at: 9:15 AM  Pick up the phone at the desk and dial [phone].  Call this number if you have problems the morning of surgery: [phone].  Remember:  Do NOT eat food or drink after:  Midnight Tuesday, November 04, 2015  Take these medicines the morning of surgery with a SIP OF WATER: None  Do NOT wear jewelry (body piercing), metal hair clips/bobby pins, make-up, or nail polish. Do NOT wear lotions, powders, or perfumes.  You may wear deodorant. Do NOT shave for 48 hours prior to surgery. Do NOT bring valuables to the hospital. Contacts, dentures, or bridgework may not be worn into surgery. Leave suitcase in car.  After surgery it may be brought to your room.  For patients admitted to the hospital, checkout time is 11:00 AM the day of discharge.

## 2015-10-28 ENCOUNTER — Encounter (HOSPITAL_COMMUNITY)
Admission: RE | Admit: 2015-10-28 | Discharge: 2015-10-28 | Disposition: A | Discharge status: Home / Self Care | Payer: Federal, State, Local not specified - PPO | Source: Ambulatory Visit | Attending: Obstetrics and Gynecology | Admitting: Obstetrics and Gynecology

## 2015-10-28 ENCOUNTER — Encounter (HOSPITAL_COMMUNITY): Payer: Self-pay

## 2015-10-28 DIAGNOSIS — D259 Leiomyoma of uterus, unspecified: Secondary | ICD-10-CM | POA: Diagnosis not present

## 2015-10-28 DIAGNOSIS — Z01812 Encounter for preprocedural laboratory examination: Secondary | ICD-10-CM | POA: Diagnosis present

## 2015-10-28 HISTORY — DX: Nonrheumatic mitral (valve) prolapse: I34.1

## 2015-10-28 HISTORY — DX: Other specified soft tissue disorders: M79.89

## 2015-10-28 HISTORY — DX: Injury of unspecified nerves of neck, initial encounter: S14.9XXA

## 2015-10-28 HISTORY — DX: Mononeuropathy, unspecified: G58.9

## 2015-10-28 HISTORY — DX: Anemia, unspecified: D64.9

## 2015-10-28 LAB — CBC
HCT: 45.5 % (ref 36.0–46.0)
Hemoglobin: 15.7 g/dL — ABNORMAL HIGH (ref 12.0–15.0)
MCH: 31.2 pg (ref 26.0–34.0)
MCHC: 34.5 g/dL (ref 30.0–36.0)
MCV: 90.3 fL (ref 78.0–100.0)
PLATELETS: 205 10*3/uL (ref 150–400)
RBC: 5.04 MIL/uL (ref 3.87–5.11)
RDW: 14.2 % (ref 11.5–15.5)
WBC: 4.7 10*3/uL (ref 4.0–10.5)

## 2015-10-29 NOTE — Anesthesia Preprocedure Evaluation (Addendum)
Anesthesia Evaluation  Patient identified by MRN, date of birth, ID band Patient awake    Reviewed: Allergy & Precautions, H&P , NPO status , Patient's Chart, lab work & pertinent test results, reviewed documented beta blocker date and time   Airway Mallampati: II  TM Distance: >3 FB Neck ROM: full    Dental  (+) Teeth Intact, Dental Advisory Given,    Pulmonary neg pulmonary ROS,    breath sounds clear to auscultation       Cardiovascular negative cardio ROS   Rhythm:Regular     Neuro/Psych negative neurological ROS  negative psych ROS   GI/Hepatic negative GI ROS, Neg liver ROS,   Endo/Other  negative endocrine ROS  Renal/GU negative Renal ROS   Fibroids negative genitourinary   Musculoskeletal   Abdominal   Peds  Hematology negative hematology ROS (+) 15/45   Anesthesia Other Findings See surgeon's H&P   Reproductive/Obstetrics negative OB ROS                            Anesthesia Physical Anesthesia Plan  ASA: II  Anesthesia Plan: General   Post-op Pain Management:    Induction: Intravenous  Airway Management Planned: Oral ETT  Additional Equipment:   Intra-op Plan:   Post-operative Plan: Extubation in OR  Informed Consent: I have reviewed the patients History and Physical, chart, labs and discussed the procedure including the risks, benefits and alternatives for the proposed anesthesia with the patient or authorized representative who has indicated his/her understanding and acceptance.     Plan Discussed with:   Anesthesia Plan Comments: (Multimodal Pain Rx)        Anesthesia Quick Evaluation

## 2015-11-04 NOTE — H&P (Signed)
Admission History and Physical Exam for a Gynecology Patient  Brenda Ball is a 55 y.o. female, No obstetric history on file., who presents for a total abdominal hysterectomy and bilateral salpingectomy.  The patient has rapidly enlarging fibroids.  Her pelvic exam was said to be normal 3 years ago. An MRI i 2016 showed a 20 week size fibroid uterus.  The patient has noted that her fibroids have gotten larger over the last 6 months.  The opinion of a GYN oncologist was given.  She was thought to unlikely have a malignancy. Her most recent Pap smear is normal.  She had a negative endometrial biopsy in the past.  She wants to proceed with definitive therapy. She has been followed at the [facility] of [facility].  OB History    No data available      Past Medical History  Diagnosis Date  . No pertinent past medical history   . MVP (mitral valve prolapse)   . Leg swelling   . Pinched nerve in neck   . Anemia     No prescriptions prior to admission    Past Surgical History  Procedure Laterality Date  . Parotid gland tumor excision      x2 on rt-last 2003  . Cesarean section    . Cervical polypectomy    . Parotidectomy  07/26/2011    Procedure: PAROTIDECTOMY;  Surgeon: Beckie Salts, MD;  Location: [facility];  Service: ENT;  Laterality: Left;  . Colonoscopy    . Upper gi endoscopy    . Wisdom tooth extraction      No Known Allergies  Family History: family history is not on file.  Social History:  reports that she has never smoked. She has never used smokeless tobacco. She reports that she does not drink alcohol or use illicit drugs.  Review of systems: See HPI.  Admission Physical Exam:    BMI equals 24.9.  There were no vitals taken for this visit.  HEENT:                 Within normal limits Chest:                   Clear Heart:                    Regular rate and rhythm Breasts:                 No masses, skin changes, bleeding, or discharge present Abdomen:             Nontender. A 24 cm mass is present in the pelvis.  The mass is firm and nontender. Extremities:          Grossly normal Neurologic exam: Grossly normal  Pelvic exam:  External genitalia: normal general appearance Vaginal: normal without tenderness, induration or masses Cervix: difficult to visualize Adnexa: difficult to palpate Uterus: 24 week size, firm, nontender   CBC    Component Value Date/Time   WBC 4.7 10/28/2015 1410   RBC 5.04 10/28/2015 1410   HGB 15.7* 10/28/2015 1410   HCT 45.5 10/28/2015 1410   PLT 205 10/28/2015 1410   MCV 90.3 10/28/2015 1410   MCH 31.2 10/28/2015 1410   MCHC 34.5 10/28/2015 1410   RDW 14.2 10/28/2015 1410    Assessment:  24 week size fibroid uterus  Enlarging fibroids  Pelvic pressure symptoms  Plan:  The patient will undergo a total abdominal hysterectomy and bilateral salpingectomy.  She understands the indications for surgical procedure.  She accepts the risk of, but not limited to, anesthetic complications, bleeding, infections, and possible damage to the surrounding organs.   [name] 11/04/2015

## 2015-11-05 ENCOUNTER — Encounter (HOSPITAL_COMMUNITY): Payer: Self-pay | Admitting: Anesthesiology

## 2015-11-05 ENCOUNTER — Inpatient Hospital Stay (HOSPITAL_COMMUNITY): Payer: Federal, State, Local not specified - PPO | Admitting: Anesthesiology

## 2015-11-05 ENCOUNTER — Inpatient Hospital Stay (HOSPITAL_COMMUNITY)
Admission: RE | Admit: 2015-11-05 | Discharge: 2015-11-07 | DRG: 743 | Disposition: A | Discharge status: Home / Self Care | Payer: Federal, State, Local not specified - PPO | Source: Ambulatory Visit | Attending: Obstetrics and Gynecology | Admitting: Obstetrics and Gynecology

## 2015-11-05 ENCOUNTER — Encounter (HOSPITAL_COMMUNITY)
Admission: RE | Disposition: A | Discharge status: Home / Self Care | Payer: Self-pay | Source: Ambulatory Visit | Attending: Obstetrics and Gynecology

## 2015-11-05 DIAGNOSIS — R102 Pelvic and perineal pain: Secondary | ICD-10-CM | POA: Diagnosis present

## 2015-11-05 DIAGNOSIS — D259 Leiomyoma of uterus, unspecified: Secondary | ICD-10-CM | POA: Diagnosis present

## 2015-11-05 DIAGNOSIS — D500 Iron deficiency anemia secondary to blood loss (chronic): Secondary | ICD-10-CM | POA: Diagnosis present

## 2015-11-05 SURGERY — HYSTERECTOMY, TOTAL, ABDOMINAL, WITH SALPINGECTOMY
Anesthesia: General | Site: Abdomen | Laterality: Bilateral

## 2015-11-05 MED ORDER — FENTANYL CITRATE (PF) 250 MCG/5ML IJ SOLN
INTRAMUSCULAR | Status: AC
  Filled 2015-11-05: qty 5

## 2015-11-05 MED ORDER — MIDAZOLAM HCL 2 MG/2ML IJ SOLN
INTRAMUSCULAR | Status: AC
  Filled 2015-11-05: qty 2

## 2015-11-05 MED ORDER — KETOROLAC TROMETHAMINE 30 MG/ML IJ SOLN
INTRAMUSCULAR | Status: DC | PRN
  Administered 2015-11-05: 30 mg via INTRAVENOUS

## 2015-11-05 MED ORDER — PHENYLEPHRINE 40 MCG/ML (10ML) SYRINGE FOR IV PUSH (FOR BLOOD PRESSURE SUPPORT)
PREFILLED_SYRINGE | INTRAVENOUS | Status: AC
  Filled 2015-11-05: qty 10

## 2015-11-05 MED ORDER — DEXAMETHASONE SODIUM PHOSPHATE 10 MG/ML IJ SOLN
INTRAMUSCULAR | Status: AC
  Filled 2015-11-05: qty 1

## 2015-11-05 MED ORDER — HYDROMORPHONE HCL 1 MG/ML IJ SOLN
INTRAMUSCULAR | Status: AC
  Filled 2015-11-05: qty 1

## 2015-11-05 MED ORDER — SODIUM CHLORIDE 0.9 % IJ SOLN
INTRAMUSCULAR | Status: AC
  Filled 2015-11-05: qty 100

## 2015-11-05 MED ORDER — BUPIVACAINE-EPINEPHRINE (PF) 0.5% -1:200000 IJ SOLN
INTRAMUSCULAR | Status: AC
  Filled 2015-11-05: qty 30

## 2015-11-05 MED ORDER — ROCURONIUM BROMIDE 100 MG/10ML IV SOLN
INTRAVENOUS | Status: AC
  Filled 2015-11-05: qty 1

## 2015-11-05 MED ORDER — HYDROMORPHONE HCL 1 MG/ML IJ SOLN
INTRAMUSCULAR | Status: DC | PRN
  Administered 2015-11-05: 1 mg via INTRAVENOUS

## 2015-11-05 MED ORDER — SODIUM CHLORIDE 0.9% FLUSH: 9.0000 mL | INTRAVENOUS | Status: DC | PRN

## 2015-11-05 MED ORDER — FENTANYL CITRATE (PF) 100 MCG/2ML IJ SOLN
INTRAMUSCULAR | Status: DC | PRN
  Administered 2015-11-05 (×2): 100 ug via INTRAVENOUS
  Administered 2015-11-05: 50 ug via INTRAVENOUS

## 2015-11-05 MED ORDER — DOCUSATE SODIUM 100 MG PO CAPS
100.0000 mg | ORAL_CAPSULE | Freq: Two times a day (BID) | ORAL | Status: DC
  Administered 2015-11-06 – 2015-11-07 (×2): 100 mg via ORAL
  Filled 2015-11-05 (×4): qty 1

## 2015-11-05 MED ORDER — ROCURONIUM BROMIDE 100 MG/10ML IV SOLN
INTRAVENOUS | Status: DC | PRN
  Administered 2015-11-05: 10 mg via INTRAVENOUS
  Administered 2015-11-05: 60 mg via INTRAVENOUS

## 2015-11-05 MED ORDER — LIDOCAINE HCL (CARDIAC) 20 MG/ML IV SOLN
INTRAVENOUS | Status: AC
  Filled 2015-11-05: qty 5

## 2015-11-05 MED ORDER — ONDANSETRON HCL 4 MG/2ML IJ SOLN: 4.0000 mg | Freq: Four times a day (QID) | INTRAMUSCULAR | Status: DC | PRN

## 2015-11-05 MED ORDER — SUGAMMADEX SODIUM 200 MG/2ML IV SOLN
INTRAVENOUS | Status: DC | PRN
  Administered 2015-11-05: 131 mg via INTRAVENOUS

## 2015-11-05 MED ORDER — SCOPOLAMINE 1 MG/3DAYS TD PT72
1.0000 | MEDICATED_PATCH | Freq: Once | TRANSDERMAL | Status: DC
  Administered 2015-11-05: 1.5 mg via TRANSDERMAL

## 2015-11-05 MED ORDER — MIDAZOLAM HCL 5 MG/5ML IJ SOLN
INTRAMUSCULAR | Status: DC | PRN
Start: 2015-11-05 — End: 2015-11-05
  Administered 2015-11-05: 2 mg via INTRAVENOUS

## 2015-11-05 MED ORDER — SUGAMMADEX SODIUM 200 MG/2ML IV SOLN
INTRAVENOUS | Status: AC
  Filled 2015-11-05: qty 2

## 2015-11-05 MED ORDER — KETOROLAC TROMETHAMINE 30 MG/ML IJ SOLN
INTRAMUSCULAR | Status: AC
  Filled 2015-11-05: qty 1

## 2015-11-05 MED ORDER — DIPHENHYDRAMINE HCL 50 MG/ML IJ SOLN: 12.5000 mg | Freq: Four times a day (QID) | INTRAMUSCULAR | Status: DC | PRN

## 2015-11-05 MED ORDER — NALOXONE HCL 0.4 MG/ML IJ SOLN: 0.4000 mg | INTRAMUSCULAR | Status: DC | PRN

## 2015-11-05 MED ORDER — ONDANSETRON HCL 4 MG/2ML IJ SOLN
INTRAMUSCULAR | Status: AC
  Filled 2015-11-05: qty 2

## 2015-11-05 MED ORDER — PROPOFOL 10 MG/ML IV BOLUS
INTRAVENOUS | Status: AC
  Filled 2015-11-05: qty 20

## 2015-11-05 MED ORDER — ONDANSETRON HCL 4 MG/2ML IJ SOLN: 4.0000 mg | Freq: Four times a day (QID) | INTRAMUSCULAR | Status: DC

## 2015-11-05 MED ORDER — PROPOFOL 10 MG/ML IV BOLUS
INTRAVENOUS | Status: DC | PRN
  Administered 2015-11-05: 200 mg via INTRAVENOUS

## 2015-11-05 MED ORDER — EPHEDRINE SULFATE 50 MG/ML IJ SOLN
INTRAMUSCULAR | Status: DC | PRN
  Administered 2015-11-05 (×2): 5 mg via INTRAVENOUS

## 2015-11-05 MED ORDER — OXYCODONE-ACETAMINOPHEN 5-325 MG PO TABS: 1.0000 | ORAL_TABLET | ORAL | Status: DC | PRN

## 2015-11-05 MED ORDER — LIDOCAINE HCL (CARDIAC) 20 MG/ML IV SOLN
INTRAVENOUS | Status: DC | PRN
  Administered 2015-11-05: 100 mg via INTRAVENOUS

## 2015-11-05 MED ORDER — EPHEDRINE 5 MG/ML INJ
INTRAVENOUS | Status: AC
  Filled 2015-11-05: qty 20

## 2015-11-05 MED ORDER — ONDANSETRON HCL 4 MG/2ML IJ SOLN
INTRAMUSCULAR | Status: DC | PRN
  Administered 2015-11-05: 4 mg via INTRAVENOUS

## 2015-11-05 MED ORDER — MEPERIDINE HCL 25 MG/ML IJ SOLN: 6.2500 mg | INTRAMUSCULAR | Status: DC | PRN

## 2015-11-05 MED ORDER — CEFAZOLIN SODIUM-DEXTROSE 2-3 GM-% IV SOLR
INTRAVENOUS | Status: AC
  Filled 2015-11-05: qty 50

## 2015-11-05 MED ORDER — FENTANYL CITRATE (PF) 100 MCG/2ML IJ SOLN: 25.0000 ug | INTRAMUSCULAR | Status: DC | PRN

## 2015-11-05 MED ORDER — BUPIVACAINE-EPINEPHRINE 0.5% -1:200000 IJ SOLN
INTRAMUSCULAR | Status: DC | PRN
  Administered 2015-11-05: 20 mL

## 2015-11-05 MED ORDER — LACTATED RINGERS IV SOLN
INTRAVENOUS | Status: DC
  Administered 2015-11-05: 17:00:00 via INTRAVENOUS
  Administered 2015-11-06: 125 mL/h via INTRAVENOUS

## 2015-11-05 MED ORDER — ONDANSETRON HCL 4 MG PO TABS
4.0000 mg | ORAL_TABLET | Freq: Three times a day (TID) | ORAL | Status: DC | PRN
Start: 2015-11-05 — End: 2015-11-07

## 2015-11-05 MED ORDER — HYDROMORPHONE 1 MG/ML IV SOLN
INTRAVENOUS | Status: DC
  Administered 2015-11-05: 14:00:00 via INTRAVENOUS
  Administered 2015-11-05 – 2015-11-06 (×2): 0.3 mg via INTRAVENOUS
  Filled 2015-11-05: qty 25

## 2015-11-05 MED ORDER — IBUPROFEN 600 MG PO TABS: 600.0000 mg | ORAL_TABLET | Freq: Four times a day (QID) | ORAL | Status: DC | PRN

## 2015-11-05 MED ORDER — SCOPOLAMINE 1 MG/3DAYS TD PT72
MEDICATED_PATCH | TRANSDERMAL | Status: AC
  Administered 2015-11-05: 1.5 mg via TRANSDERMAL
  Filled 2015-11-05: qty 1

## 2015-11-05 MED ORDER — DIPHENHYDRAMINE HCL 12.5 MG/5ML PO ELIX: 12.5000 mg | ORAL_SOLUTION | Freq: Four times a day (QID) | ORAL | Status: DC | PRN

## 2015-11-05 MED ORDER — CEFAZOLIN SODIUM-DEXTROSE 2-4 GM/100ML-% IV SOLN
2.0000 g | INTRAVENOUS | Status: AC
  Administered 2015-11-05: 2 g via INTRAVENOUS
  Filled 2015-11-05: qty 100

## 2015-11-05 MED ORDER — MENTHOL 3 MG MT LOZG: 1.0000 | LOZENGE | OROMUCOSAL | Status: DC | PRN

## 2015-11-05 MED ORDER — VASOPRESSIN 20 UNIT/ML IV SOLN
INTRAVENOUS | Status: AC
  Filled 2015-11-05: qty 1

## 2015-11-05 MED ORDER — PHENYLEPHRINE HCL 10 MG/ML IJ SOLN
INTRAMUSCULAR | Status: DC | PRN
  Administered 2015-11-05 (×2): 40 ug via INTRAVENOUS
  Administered 2015-11-05: 80 ug via INTRAVENOUS
  Administered 2015-11-05: 40 ug via INTRAVENOUS

## 2015-11-05 MED ORDER — LACTATED RINGERS IV SOLN
INTRAVENOUS | Status: DC
  Administered 2015-11-05 (×3): via INTRAVENOUS

## 2015-11-05 MED ORDER — KETOROLAC TROMETHAMINE 30 MG/ML IJ SOLN
30.0000 mg | Freq: Four times a day (QID) | INTRAMUSCULAR | Status: AC
Start: 2015-11-05 — End: 2015-11-06
  Administered 2015-11-05 – 2015-11-06 (×3): 30 mg via INTRAVENOUS
  Filled 2015-11-05 (×3): qty 1

## 2015-11-05 MED ORDER — DEXAMETHASONE SODIUM PHOSPHATE 10 MG/ML IJ SOLN
INTRAMUSCULAR | Status: DC | PRN
  Administered 2015-11-05: 10 mg via INTRAVENOUS

## 2015-11-05 SURGICAL SUPPLY — 47 items
CANISTER SUCT 3000ML (MISCELLANEOUS) ×3 IMPLANT
CATH FOLEY 3WAY 30CC 16FR (CATHETERS) IMPLANT
CLOTH BEACON ORANGE TIMEOUT ST (SAFETY) ×3 IMPLANT
CONT PATH 16OZ SNAP LID 3702 (MISCELLANEOUS) ×3 IMPLANT
DECANTER SPIKE VIAL GLASS SM (MISCELLANEOUS) IMPLANT
DRAPE CESAREAN BIRTH W POUCH (DRAPES) ×2 IMPLANT
DRAPE WARM FLUID 44X44 (DRAPE) ×2 IMPLANT
DRSG OPSITE POSTOP 4X10 (GAUZE/BANDAGES/DRESSINGS) ×3 IMPLANT
DURAPREP 26ML APPLICATOR (WOUND CARE) ×3 IMPLANT
EXTRACTOR VACUUM KIWI (MISCELLANEOUS) ×2 IMPLANT
FORMULA 20CAL 3 OZ MEAD (FORMULA) IMPLANT
GAUZE SPONGE 4X4 16PLY XRAY LF (GAUZE/BANDAGES/DRESSINGS) ×2 IMPLANT
GLOVE BIOGEL PI IND STRL 7.0 (GLOVE) ×3 IMPLANT
GLOVE BIOGEL PI IND STRL 8.5 (GLOVE) ×1 IMPLANT
GLOVE BIOGEL PI INDICATOR 7.0 (GLOVE) ×6
GLOVE BIOGEL PI INDICATOR 8.5 (GLOVE) ×2
GLOVE ECLIPSE 8.0 STRL XLNG CF (GLOVE) ×6 IMPLANT
GOWN STRL REUS W/TWL 2XL LVL3 (GOWN DISPOSABLE) ×3 IMPLANT
GOWN STRL REUS W/TWL LRG LVL3 (GOWN DISPOSABLE) ×6 IMPLANT
NDL HYPO 18GX1.5 BLUNT FILL (NEEDLE) IMPLANT
NEEDLE HYPO 18GX1.5 BLUNT FILL (NEEDLE) IMPLANT
NEEDLE HYPO 22GX1.5 SAFETY (NEEDLE) ×2 IMPLANT
NS IRRIG 1000ML POUR BTL (IV SOLUTION) ×3 IMPLANT
PACK ABDOMINAL GYN (CUSTOM PROCEDURE TRAY) ×3 IMPLANT
PAD OB MATERNITY 4.3X12.25 (PERSONAL CARE ITEMS) ×3 IMPLANT
PENCIL SMOKE EVAC W/HOLSTER (ELECTROSURGICAL) ×3 IMPLANT
PLUG CATH AND CAP STER (CATHETERS) IMPLANT
SPONGE LAP 18X18 X RAY DECT (DISPOSABLE) ×6 IMPLANT
STAPLER VISISTAT 35W (STAPLE) ×1 IMPLANT
SUT CHROMIC 3 0 SH 27 (SUTURE) ×3 IMPLANT
SUT MNCRL AB 3-0 PS2 27 (SUTURE) IMPLANT
SUT PDS AB 1 CTX 36 (SUTURE) IMPLANT
SUT VIC AB 0 CT1 18XCR BRD8 (SUTURE) ×3 IMPLANT
SUT VIC AB 0 CT1 27 (SUTURE) ×6
SUT VIC AB 0 CT1 27XBRD ANBCTR (SUTURE) ×2 IMPLANT
SUT VIC AB 0 CT1 8-18 (SUTURE) ×9
SUT VIC AB 2-0 CT1 27 (SUTURE)
SUT VIC AB 2-0 CT1 TAPERPNT 27 (SUTURE) IMPLANT
SUT VIC AB 3-0 CT1 27 (SUTURE) ×3
SUT VIC AB 3-0 CT1 TAPERPNT 27 (SUTURE) ×1 IMPLANT
SUT VIC AB 4-0 PS2 27 (SUTURE) IMPLANT
SUT VICRYL 0 TIES 12 18 (SUTURE) ×3 IMPLANT
SYR 30ML LL (SYRINGE) IMPLANT
SYR CONTROL 10ML LL (SYRINGE) ×2 IMPLANT
TOWEL OR 17X24 6PK STRL BLUE (TOWEL DISPOSABLE) ×6 IMPLANT
TRAY FOLEY CATH SILVER 14FR (SET/KITS/TRAYS/PACK) ×3 IMPLANT
WATER STERILE IRR 1000ML POUR (IV SOLUTION) ×3 IMPLANT

## 2015-11-05 NOTE — Op Note (Signed)
OPERATIVE NOTE  Brenda Ball  DOB:    12/16/1960  MRN:    [id number]  CSN:    [id number]  Date of Surgery:  11/05/2015  Preoperative Diagnosis:  Rapidly enlarging fibroid uterus  Pelvic pressure symptoms  Postoperative Diagnosis:  Same  Procedure:  Total abdominal hysterectomy  Bilateral salpingectomy  Surgeon:  Gildardo Cranker, M.D.  Assistant:  Earnstine Regal, PA-C  Anesthetic:  General  Disposition:  The patient presents with a rapidly enlarging fibroid uterus. An MRI showed a 20 cm fibroid. The patient understands the indications for surgical procedure. She accepts the risk of, but not limited to, anesthetic complications, bleeding, infections, and possible damage to surrounding organs.  Findings:  A 1950 g uterus was removed. The patient had a large fundal fibroid. The fallopian tubes and ovaries appeared normal. The abdominal structures appeared normal. The ureters were visualized and were felt to be free of our operative fields.  Procedure:  The patient was taken to the operating room where a general anesthetic was given. A timeout was performed confirming the appropriate procedure. The patient's abdomen was prepped with DuraPrep. Her perineum and vagina were prepped with Betadine. A Foley catheter was placed in the bladder. The patient was sterilely draped. The patient's lower abdomen was injected with 10 cc of half percent Marcaine with epinephrine. A large, low transverse incision was made in the lower abdomen (according to her wishes) and carried sharply through the subcutaneous tissue, the fascia, and the anterior peritoneum. The pelvis was explored with findings as mentioned above. The ureters were identified bilaterally, and we felt that we could safely proceed. The uterus was elevated into the operative field. Pictures were taken. The upper pedicles were clamped. The round ligament was identified on the right. The round ligament was suture ligated  and cut. The right adnexa was then isolated, doubly clamped, cut, free tied, and suture ligated. An identical procedure was carried out on the opposite side. The bladder flap was developed anteriorly. Alternating from left to right, the uterine arteries, parametrial tissues, paracervical tissues, uterosacral ligaments, and the vaginal angles were clamped, cut, sutured, and tied securely. The cervix was transected from the apex of the vagina. The uterus was removed from the operative field. The vaginal cuff was closed using figure-of-eight sutures. The pelvis was irrigated. Hemostasis was confirmed. The right fallopian tube was identified. The mesosalpinx was clamped and cut. The defect was closed using a figure-of-eight suture. An identical procedure was carried out on the left. The pelvis was again irrigated. Hemostasis was confirmed. All instruments and packs were removed from the abdominal cavity. The anterior peritoneum and the abdominal musculature were reapproximated in the midline using figure-of-eight sutures. The abdominal musculature and the fascia were irrigated. Hemostasis was adequate. The fascia was closed using two running sutures followed by several interrupted sutures. The subcutaneous layer was closed using interrupted sutures. The skin was reapproximated using a subcuticular suture of 3-0 Monocryl. 0 Vicryl is the suture material used except where otherwise mentioned.  Sponge, needle, and instrument counts were correct on 2 occasions. The estimated blood loss for the procedure was 200. The patient tolerated her procedure well. She was awakened from her anesthetic without difficulty. She was transported to the recovery room in stable condition. She was noted to drain clear yellow urine. Pathology specimens included: Uterus including the cervix, and bilateral fallopian tubes.   Gildardo Cranker, M.D.  11/05/2015

## 2015-11-05 NOTE — Anesthesia Procedure Notes (Signed)
Procedure Name: Intubation Date/Time: 11/05/2015 10:32 AM Performed by: Riki Sheer Pre-anesthesia Checklist: Patient identified, Emergency Drugs available, Suction available, Patient being monitored and Timeout performed Patient Re-evaluated:Patient Re-evaluated prior to inductionOxygen Delivery Method: Circle system utilized Preoxygenation: Pre-oxygenation with 100% oxygen Intubation Type: IV induction Ventilation: Mask ventilation without difficulty Laryngoscope Size: Miller and 2 Grade View: Grade I Tube type: Oral Tube size: 7.0 mm Number of attempts: 1 Airway Equipment and Method: Stylet Placement Confirmation: ETT inserted through vocal cords under direct vision,  positive ETCO2,  CO2 detector and breath sounds checked- equal and bilateral Secured at: 22 cm Tube secured with: Tape Dental Injury: Teeth and Oropharynx as per pre-operative assessment

## 2015-11-05 NOTE — Anesthesia Postprocedure Evaluation (Signed)
Anesthesia Post Note  Patient: CURTISHA WRICE  Procedure(s) Performed: Procedure(s) (LRB): HYSTERECTOMY ABDOMINAL WITH SALPINGECTOMY (Bilateral)  Patient location during evaluation: PACU Anesthesia Type: General Level of consciousness: awake and alert Pain management: pain level controlled Vital Signs Assessment: post-procedure vital signs reviewed and stable Respiratory status: spontaneous breathing, nonlabored ventilation, respiratory function stable and patient connected to nasal cannula oxygen Cardiovascular status: blood pressure returned to baseline and stable Postop Assessment: no signs of nausea or vomiting Anesthetic complications: no    Last Vitals:  Filed Vitals:   11/05/15 1245 11/05/15 1300  BP: 102/74   Pulse: 71 69  Temp:    Resp: 16 15    Last Pain: There were no vitals filed for this visit.               Alexis Frock

## 2015-11-05 NOTE — Progress Notes (Signed)
The patient was interviewed and examined today.  The previously documented history and physical examination was reviewed. There are no changes. The operative procedure was reviewed. The risks and benefits were outlined again. The specific risks include, but are not limited to, anesthetic complications, bleeding, infections, and possible damage to the surrounding organs. The patient's questions were answered.  We are ready to proceed as outlined. The likelihood of the patient achieving the goals of this procedure is very likely.   BP 137/89 mmHg  Pulse 73  Temp(Src) 97.6 F (36.4 C) (Oral)  Resp 20  SpO2 100%   Gildardo Cranker, M.D.

## 2015-11-05 NOTE — Transfer of Care (Signed)
Immediate Anesthesia Transfer of Care Note  Patient: Brenda Ball  Procedure(s) Performed: Procedure(s): HYSTERECTOMY ABDOMINAL WITH SALPINGECTOMY (Bilateral)  Patient Location: PACU  Anesthesia Type:General  Level of Consciousness: sedated  Airway & Oxygen Therapy: Patient Spontanous Breathing and Patient connected to nasal cannula oxygen  Post-op Assessment: Report given to RN and Post -op Vital signs reviewed and stable  Post vital signs: Reviewed and stable  Last Vitals:  Filed Vitals:   11/05/15 0933  BP: 137/89  Pulse: 73  Temp: 36.4 C  Resp: 20    Complications: No apparent anesthesia complications

## 2015-11-05 NOTE — Progress Notes (Signed)
Subjective:  Patient reports doing OK.    Objective: I have reviewed patient's vital signs and intake and output.  General: alert and no distress Resp: clear to auscultation bilaterally Cardio: regular rate and rhythm, S1, S2 normal, no murmur, click, rub or gallop GI: soft Extremities: extremities normal, atraumatic, no cyanosis or edema   Assessment/Plan:  Doing well day 0 TAH  Routine care.   LOS: 0 days    Shunsuke Granzow V 11/05/2015, 7:18 PM

## 2015-11-06 LAB — CBC
HEMATOCRIT: 27.5 % — AB (ref 36.0–46.0)
HEMOGLOBIN: 9.2 g/dL — AB (ref 12.0–15.0)
MCH: 30.1 pg (ref 26.0–34.0)
MCHC: 33.5 g/dL (ref 30.0–36.0)
MCV: 89.9 fL (ref 78.0–100.0)
Platelets: 182 10*3/uL (ref 150–400)
RBC: 3.06 MIL/uL — ABNORMAL LOW (ref 3.87–5.11)
RDW: 14.1 % (ref 11.5–15.5)
WBC: 10.4 10*3/uL (ref 4.0–10.5)

## 2015-11-06 NOTE — Addendum Note (Signed)
Addendum  created 11/06/15 0738 by Riki Sheer, CRNA   Modules edited: Clinical Notes   Clinical Notes:  File: [id number]

## 2015-11-06 NOTE — Addendum Note (Signed)
Addendum  created 11/06/15 1659 by Genevie Ann, CRNA   Modules edited: Clinical Notes   Clinical Notes:  File: [id number]

## 2015-11-06 NOTE — Anesthesia Postprocedure Evaluation (Signed)
Anesthesia Post Note  Patient: Brenda Ball  Procedure(s) Performed: Procedure(s) (LRB): HYSTERECTOMY ABDOMINAL WITH SALPINGECTOMY (Bilateral)  Patient location during evaluation: [facility] Anesthesia Type: General Level of consciousness: awake and alert Pain management: pain level controlled Vital Signs Assessment: post-procedure vital signs reviewed and stable Respiratory status: spontaneous breathing Cardiovascular status: blood pressure returned to baseline Postop Assessment: adequate PO intake and no signs of nausea or vomiting Anesthetic complications: no    Last Vitals:  Filed Vitals:   11/06/15 1030 11/06/15 1405  BP: 103/53 90/54  Pulse: 88 84  Temp: 37.2 C 37 C  Resp: 18 18    Last Pain:  Filed Vitals:   11/06/15 1532  PainSc: 3                  [name], [employer]

## 2015-11-06 NOTE — Anesthesia Postprocedure Evaluation (Signed)
Anesthesia Post Note  Patient: MERSADES URBANIAK  Procedure(s) Performed: Procedure(s) (LRB): HYSTERECTOMY ABDOMINAL WITH SALPINGECTOMY (Bilateral)  Patient location during evaluation: [facility] Anesthesia Type: General Level of consciousness: awake and alert Pain management: pain level controlled Vital Signs Assessment: post-procedure vital signs reviewed and stable Respiratory status: spontaneous breathing, nonlabored ventilation and respiratory function stable Cardiovascular status: blood pressure returned to baseline and stable Postop Assessment: no signs of nausea or vomiting Anesthetic complications: no    Last Vitals:  Filed Vitals:   11/06/15 0217 11/06/15 0537  BP: 89/48   Pulse: 89 75  Temp: 37.1 C 36.9 C  Resp: 14 22    Last Pain:  Filed Vitals:   11/06/15 0631  PainSc: 0-No pain                 Telena Peyser

## 2015-11-06 NOTE — Progress Notes (Signed)
Subjective: Patient reports tolerating PO and no problems voiding.    Objective: I have reviewed patient's vital signs, intake and output and labs.  General: alert and no distress Resp: clear to auscultation bilaterally Cardio: regular rate and rhythm, S1, S2 normal, no murmur, click, rub or gallop GI: soft, non-tender; bowel sounds normal; no masses,  no organomegaly Extremities: extremities normal, atraumatic, no cyanosis or edema Vaginal Bleeding: none   Assessment/Plan:  Doing well on postoperative day #1, total abdominal hysterectomy and bilateral salpingectomy  Anemia without orthostasis  Transfusion offered but declined. Risk and benefits outlined.  Plan to discharge to home tomorrow.  LOS: 1 day    Izeyah Deike V 11/06/2015, 10:24 AM

## 2015-11-07 MED ORDER — OXYCODONE-ACETAMINOPHEN 5-325 MG PO TABS: 1.0000 | ORAL_TABLET | ORAL | Status: AC | PRN

## 2015-11-07 MED ORDER — IBUPROFEN 800 MG PO TABS: 800.0000 mg | ORAL_TABLET | Freq: Three times a day (TID) | ORAL | Status: AC | PRN

## 2015-11-07 NOTE — Clinical Documentation Improvement (Signed)
OB/GYN  Can the diagnosis of anemia be further specified?   Acute Blood Loss Anemia, including the suspected or known cause or associated condition(s)  Acute on chronic blood loss anemia, including the suspected or known cause or associated condition(s)  Chronic blood loss anemia, including the suspected or known cause or associated condition(s)  Precipitous drop in Hematocrit, including the suspected or known cause or associated condition(s)  Other  Clinically Undetermined  Document any associated diagnoses/conditions. Please update your documentation within the medical record to reflect your response to this query. Thank you.  Supporting Information:(as per notes) "postoperative day #1, & total abdominal hysterectomy and bilateral salpingectomy " "Anemia without orthostasis "  Labs: Component     Latest Ref Rng 10/28/2015 11/06/2015  WBC     4.0 - 10.5 K/uL 4.7 10.4  RBC     3.87 - 5.11 MIL/uL 5.04 3.06 (L)  Hemoglobin     12.0 - 15.0 g/dL 15.7 (H) 9.2 (L)   Please exercise your independent, professional judgment when responding. A specific answer is not anticipated or expected.  Thank You, Alessandra Grout, RN, BSN, CCDS,Clinical Documentation Specialist:  [phone]  [phone]=[employer]- Health Information Management

## 2015-11-07 NOTE — Discharge Instructions (Signed)
Call [facility] @ [phone] if:  You have a temperature greater than or equal to 100.4 degrees Farenheit orally You have pain that is not made better by the pain medication given and taken as directed You have excessive bleeding or problems urinating  Take Colace (Docusate Sodium/Stool Softener) 100 mg 2-3 times daily while taking narcotic pain medicine to avoid constipation or until bowel movements are regular. Take,  with food,  Ibuprofen as directed for the next 5 days then as needed for pain  You may drive after 2  weeks You may walk up steps  You may shower  You may resume a regular diet  Keep incisions clean and dry, remove honeycomb dressing on November 11, 2015 Do not lift over 15 pounds for 6 weeks Avoid anything in vagina for 6 weeks (or until after your post-operative visit) Abdominal Hysterectomy, Care After Refer to this sheet in the next few weeks. These instructions provide you with information on caring for yourself after your procedure. Your health care provider may also give you more specific instructions. Your treatment has been planned according to current medical practices, but problems sometimes occur. Call your health care provider if you have any problems or questions after your procedure.  WHAT TO EXPECT AFTER THE PROCEDURE After your procedure, it is typical to have the following:  Pain.  Feeling tired.  Poor appetite.  Less interest in sex. It takes 4-6 weeks to recover from this surgery.  HOME CARE INSTRUCTIONS   Take pain medicines only as directed by your health care provider. Do not take over-the-counter pain medicines without checking with your health care provider first.  Change your bandage as directed by your health care provider.  Return to your health care provider to have your sutures taken out.  Take showers instead of baths for 2-3 weeks. Ask your health care provider when it is safe to start showering.  Do not douche,  use tampons, or have sexual intercourse for at least 6 weeks or until your health care provider says you can.   Follow your health care provider's advice about exercise, lifting, driving, and general activities.  Get plenty of rest and sleep.   Do not lift anything heavier than a gallon of milk (about 10 lb [4.5 kg]) for the first month after surgery.  You can resume your normal diet if your health care provider says it is okay.   Do not drink alcohol until your health care provider says you can.   If you are constipated, ask your health care provider if you can take a mild laxative.  Eating foods high in fiber may also help with constipation. Eat plenty of raw fruits and vegetables, whole grains, and beans.  Drink enough fluids to keep your urine clear or pale yellow.   Try to have someone at home with you for the first 1-2 weeks to help around the house.  Keep all follow-up appointments. SEEK MEDICAL CARE IF:   You have chills or fever.  You have swelling, redness, or pain in the area of your incision that is getting worse.   You have pus coming from the incision.   You notice a bad smell coming from the incision or bandage.   Your incision breaks open.   You feel dizzy or light-headed.   You have pain or bleeding when you urinate.   You have persistent diarrhea.   You have persistent nausea and vomiting.   You have abnormal vaginal discharge.  You have a rash.   You have any type of abnormal reaction or develop an allergy to your medicine.   Your pain medicine is not helping.  SEEK IMMEDIATE MEDICAL CARE IF:   You have a fever and your symptoms suddenly get worse.  You have severe abdominal pain.  You have chest pain.  You have shortness of breath.  You faint.  You have pain, swelling, or redness of your leg.  You have heavy vaginal bleeding with blood clots. MAKE SURE YOU:  Understand these instructions.  Will watch your  condition.  Will get help right away if you are not doing well or get worse.   This information is not intended to replace advice given to you by your health care provider. Make sure you discuss any questions you have with your health care provider.   Document Released: 01/22/2005 Document Revised: 07/26/2014 Document Reviewed: 04/27/2013 Elsevier Interactive Patient Education [employer].

## 2015-11-07 NOTE — Progress Notes (Signed)
Patient discharged home with husband... Discharge instructions reviewed with patient and she verbalized understanding... Condition stable... No equipment... Ambulated to car with Huntley Dec, RN.

## 2015-11-07 NOTE — Discharge Summary (Signed)
Physician Discharge Summary  Patient ID: Brenda Ball MRN: [id number] DOB/AGE: August 12, 1960 55 y.o.  Admit date:         11/05/2015 Discharge date: 11/07/2015  Admission Diagnoses:  Rapidly Enlarging Fibroids, Pelvic Pain  Discharge Diagnoses:   Rapidly Enlarging Fibroids, Pelvic Pain  anemia  Procedures this Admission:  11/05/2015  Procedure(s) (LRB): HYSTERECTOMY ABDOMINAL WITH SALPINGECTOMY (Bilateral)  Discharged Condition: good   Admission Hx and PE: The patient has been followed at the [facility]. She has a history of  Rapidly Enlarging Fibroids, Pelvic Pain. Please see her documented history and physical exam. An MRI showed 24 weeks size uterus.  Hospital course:  On the day of admission, the patient underwent the following Procedure(s): HYSTERECTOMY ABDOMINAL WITH SALPINGECTOMY. Operative findings included a 1950 g uterus.  The fallopian tubes and ovaries appear normal.. Her postoperative course was uneventful. The patient was noted to have anemia, but she was not orthostatic. She quickly tolerated a regular diet. Her postoperative pain was controlled with oral medication. She remained afebrile. Today she was felt to be ready for discharge.  Labs:  HEMOGLOBIN  Date Value Ref Range Status  11/06/2015 9.2* 12.0 - 15.0 g/dL Final   HCT  Date Value Ref Range Status  11/06/2015 27.5* 36.0 - 46.0 % Final    Consults: None  Final pathology report: Pending at the time of discharge  Disposition:  The patient will be discharged to home. She has been given a copy of the discharge instructions as prepared by the [facility] for patients who have undergone the Procedure(s): HYSTERECTOMY ABDOMINAL WITH SALPINGECTOMY.      Medication List    TAKE these medications        ibuprofen 800 MG tablet  Commonly known as:  ADVIL,MOTRIN  Take 1 tablet (800 mg total) by mouth every 8 (eight) hours as  needed.     OVER THE COUNTER MEDICATION  Take 4 capsules by mouth daily. Pyridoxal 1-3 (vitamin C, Calcium, heme iron, and pyridoxine). Patient takes 3 days in a row and then doesn't take for 4 days each week.     OVER THE COUNTER MEDICATION  Take 1 capsule by mouth daily. Carbon 98BX (EPA, DHA, GLA, DGLA, and DHEA).  Patient takes 5 days in a row and then doesn't take for 2 days each week.     OVER THE COUNTER MEDICATION  Take 4 capsules by mouth daily. Chromic Chloride AX1-13 (Magnesium, calcium, vitamin C, zinc, and copper).  Patient takes 6 days in a row and then doesn't take for 1 day each week.     OVER THE COUNTER MEDICATION  Take 1 capsule by mouth daily. Sulfur 24G7 (magnesium, B6, B12, vit D, Manganese, lipolic acid, and s. Adensoylmethionine).  Patient takes 5 days in a row and then doesn't take for 2 days each week.Marland Kitchen     oxyCODONE-acetaminophen 5-325 MG tablet  Commonly known as:  ROXICET  Take 1 tablet by mouth every 4 (four) hours as needed for severe pain.      The patient was instructed to take an iron tablet over-the-counter twice each day.  A high fiber diet was recommended to combat constipation.  The patient declines stool softeners.     Follow-up Information    Follow up with [name], MD On 12/11/2015.   Specialty:  Obstetrics and Gynecology   Why:  Appointment time is 2:45 p.m.   Contact information:   [address] [place] [address] [phone]       Follow up with [name], MD In 6 weeks.   Specialty:  Obstetrics and Gynecology   Contact information:   [address] [place] [address] [phone]       Signed: [name] 11/07/2015, 4:56 PM

## 2015-11-19 ENCOUNTER — Inpatient Hospital Stay (HOSPITAL_COMMUNITY)
Admission: AD | Admit: 2015-11-19 | Discharge: 2015-11-19 | Disposition: A | Discharge status: Home / Self Care | Payer: Federal, State, Local not specified - PPO | Source: Ambulatory Visit | Attending: Obstetrics & Gynecology | Admitting: Obstetrics & Gynecology

## 2015-11-19 DIAGNOSIS — D649 Anemia, unspecified: Secondary | ICD-10-CM | POA: Insufficient documentation

## 2015-11-19 DIAGNOSIS — N938 Other specified abnormal uterine and vaginal bleeding: Secondary | ICD-10-CM | POA: Insufficient documentation

## 2015-11-19 LAB — CBC
HEMATOCRIT: 34.1 % — AB (ref 36.0–46.0)
Hemoglobin: 11.7 g/dL — ABNORMAL LOW (ref 12.0–15.0)
MCH: 31.4 pg (ref 26.0–34.0)
MCHC: 34.3 g/dL (ref 30.0–36.0)
MCV: 91.4 fL (ref 78.0–100.0)
PLATELETS: 536 10*3/uL — AB (ref 150–400)
RBC: 3.73 MIL/uL — ABNORMAL LOW (ref 3.87–5.11)
RDW: 14.9 % (ref 11.5–15.5)
WBC: 16 10*3/uL — AB (ref 4.0–10.5)

## 2015-11-19 LAB — URINE MICROSCOPIC-ADD ON

## 2015-11-19 LAB — URINALYSIS, ROUTINE W REFLEX MICROSCOPIC
BILIRUBIN URINE: NEGATIVE
Glucose, UA: NEGATIVE mg/dL
Ketones, ur: NEGATIVE mg/dL
Nitrite: NEGATIVE
PROTEIN: 100 mg/dL — AB
Specific Gravity, Urine: <1.005 — ABNORMAL LOW (ref 1.005–1.030)
pH: 5.5 (ref 5.0–8.0)

## 2015-11-19 NOTE — MAU Note (Signed)
Dr. Raphael Gibney preformed total ab hysterectomy on 11/04/15.  Pt states everything has been fine and then she started bleeding heavy this morning about 0800.  Pt has changed 3 full sanitary pads since awaking.  Pt states prior to today, bleeding has been fine.

## 2015-11-19 NOTE — MAU Provider Note (Signed)
MAU History and Physical Exam for a Gynecology Patient  [name] is a 55 y.o. female who presents for evaluation of vaginal bleeding. She has been followed at the [facility]. She had a total abdominal hysterectomy on 11/05/2015 for large fibroids. She tolerated her procedure well. She reports that she has had spotting for several days. Today her bleeding was heavier and she was concerned. She complains of mild pelvic discomfort associated with constipation.  OB History    No data available      Past Medical History  Diagnosis Date  . No pertinent past medical history   . MVP (mitral valve prolapse)   . Leg swelling   . Pinched nerve in neck   . Anemia     Prescriptions prior to admission  Medication Sig Dispense Refill Last Dose  . ibuprofen (ADVIL,MOTRIN) 800 MG tablet Take 1 tablet (800 mg total) by mouth every 8 (eight) hours as needed. 50 tablet 1   . OVER THE COUNTER MEDICATION Take 4 capsules by mouth daily. Pyridoxal 1-3 (vitamin C, Calcium, heme iron, and pyridoxine). Patient takes 3 days in a row and then doesn't take for 4 days each week.     Marland Kitchen OVER THE COUNTER MEDICATION Take 1 capsule by mouth daily. Carbon 98BX (EPA, DHA, GLA, DGLA, and DHEA).  Patient takes 5 days in a row and then doesn't take for 2 days each week.     Marland Kitchen OVER THE COUNTER MEDICATION Take 4 capsules by mouth daily. Chromic Chloride AX1-13 (Magnesium, calcium, vitamin C, zinc, and copper).  Patient takes 6 days in a row and then doesn't take for 1 day each week.     Marland Kitchen OVER THE COUNTER MEDICATION Take 1 capsule by mouth daily. Sulfur 24G7 (magnesium, B6, B12, vit D, Manganese, lipolic acid, and s. Adensoylmethionine).  Patient takes 5 days in a row and then doesn't take for 2 days each week..     . oxyCODONE-acetaminophen (ROXICET) 5-325 MG tablet Take 1 tablet by mouth every 4 (four) hours as needed for severe pain. 15 tablet 0      Past Surgical History  Procedure Laterality Date  . Parotid gland tumor excision      x2 on rt-last 2003  . Cesarean section    . Cervical polypectomy    . Parotidectomy  07/26/2011    Procedure: PAROTIDECTOMY;  Surgeon: Beckie Salts, MD;  Location: [facility];  Service: ENT;  Laterality: Left;  . Colonoscopy    . Upper gi endoscopy    . Wisdom tooth extraction      No Known Allergies  Family History: family history is not on file.  Social History:  reports that she has never smoked. She has never used smokeless tobacco. She reports that she does not drink alcohol or use illicit drugs.  Review of systems: See HPI.  Admission Physical Exam:    Body mass index is 23.03 kg/(m^2).  Blood pressure 104/61, pulse 85, temperature 98.6 F (37 C), temperature source Oral, resp. rate 16, height 5' 4.5" (1.638 m), weight 136 lb 3.2 oz (61.78 kg), last menstrual period 10/24/2015, SpO2 100 %.  HEENT:                 Within normal limits Chest:                   Clear Heart:  Regular rate and rhythm Breasts:                No masses, skin changes, bleeding, or discharge present Abdomen:             Nontender, no masses Extremities:          Grossly normal Neurologic exam: Grossly normal  Pelvic exam:  External genitalia: normal general appearance Vaginal: The cuff is well healed. A small amount of spotting is noted. No evidence of infection. Cervix: absent Adnexa: normal bimanual exam Uterus: absent  Assessment:  Status post abdominal hysterectomy on 11/05/2015  Vaginal bleeding associated with suture deterioration.   Anemia  No evidence of infection  Plan:  I reassured the patient. She can call at any time if her bleeding becomes concerning. She will return in 3 weeks for follow-up examination.   CBC    Component Value Date/Time   WBC 16.0* 11/19/2015 1203   RBC 3.73* 11/19/2015 1203   HGB 11.7* 11/19/2015 1203   HCT 34.1*  11/19/2015 1203   PLT 536* 11/19/2015 1203   MCV 91.4 11/19/2015 1203   MCH 31.4 11/19/2015 1203   MCHC 34.3 11/19/2015 1203   RDW 14.9 11/19/2015 1203      Brenda Ball V 11/19/2015

## 2015-11-19 NOTE — Discharge Instructions (Signed)
Anemia, Nonspecific Anemia is a condition in which the concentration of red blood cells or hemoglobin in the blood is below normal. Hemoglobin is a substance in red blood cells that carries oxygen to the tissues of the body. Anemia results in not enough oxygen reaching these tissues.  CAUSES  Common causes of anemia include:   Excessive bleeding. Bleeding may be internal or external. This includes excessive bleeding from periods (in women) or from the intestine.   Poor nutrition.   Chronic kidney, thyroid, and liver disease.  Bone marrow disorders that decrease red blood cell production.  Cancer and treatments for cancer.  HIV, AIDS, and their treatments.  Spleen problems that increase red blood cell destruction.  Blood disorders.  Excess destruction of red blood cells due to infection, medicines, and autoimmune disorders. SIGNS AND SYMPTOMS   Minor weakness.   Dizziness.   Headache.  Palpitations.   Shortness of breath, especially with exercise.   Paleness.  Cold sensitivity.  Indigestion.  Nausea.  Difficulty sleeping.  Difficulty concentrating. Symptoms may occur suddenly or they may develop slowly.  DIAGNOSIS  Additional blood tests are often needed. These help your health care provider determine the best treatment. Your health care provider will check your stool for blood and look for other causes of blood loss.  TREATMENT  Treatment varies depending on the cause of the anemia. Treatment can include:   Supplements of iron, vitamin B12, or folic acid.   Hormone medicines.   A blood transfusion. This may be needed if blood loss is severe.   Hospitalization. This may be needed if there is significant continual blood loss.   Dietary changes.  Spleen removal. HOME CARE INSTRUCTIONS Keep all follow-up appointments. It often takes many weeks to correct anemia, and having your health care provider check on your condition and your response to  treatment is very important. SEEK IMMEDIATE MEDICAL CARE IF:   You develop extreme weakness, shortness of breath, or chest pain.   You become dizzy or have trouble concentrating.  You develop heavy vaginal bleeding.   You develop a rash.   You have bloody or black, tarry stools.   You faint.   You vomit up blood.   You vomit repeatedly.   You have abdominal pain.  You have a fever or persistent symptoms for more than 2-3 days.   You have a fever and your symptoms suddenly get worse.   You are dehydrated.  MAKE SURE YOU:  Understand these instructions.  Will watch your condition.  Will get help right away if you are not doing well or get worse.   This information is not intended to replace advice given to you by your health care provider. Make sure you discuss any questions you have with your health care provider.   Document Released: 08/12/2004 Document Revised: 03/07/2013 Document Reviewed: 12/29/2012 Elsevier Interactive Patient Education 2016 [employer]  

## 2016-02-26 ENCOUNTER — Other Ambulatory Visit: Payer: Self-pay | Admitting: Obstetrics and Gynecology

## 2016-02-26 DIAGNOSIS — Z1231 Encounter for screening mammogram for malignant neoplasm of breast: Secondary | ICD-10-CM

## 2016-03-25 ENCOUNTER — Ambulatory Visit
Admission: RE | Admit: 2016-03-25 | Discharge: 2016-03-25 | Disposition: A | Discharge status: Home / Self Care | Payer: Federal, State, Local not specified - PPO | Source: Ambulatory Visit | Attending: Obstetrics and Gynecology | Admitting: Obstetrics and Gynecology

## 2016-03-25 DIAGNOSIS — Z1231 Encounter for screening mammogram for malignant neoplasm of breast: Secondary | ICD-10-CM

## 2016-09-23 DIAGNOSIS — F4322 Adjustment disorder with anxiety: Secondary | ICD-10-CM | POA: Diagnosis not present

## 2016-10-11 DIAGNOSIS — F4322 Adjustment disorder with anxiety: Secondary | ICD-10-CM | POA: Diagnosis not present

## 2016-10-25 DIAGNOSIS — D259 Leiomyoma of uterus, unspecified: Secondary | ICD-10-CM | POA: Diagnosis not present

## 2016-11-25 DIAGNOSIS — F411 Generalized anxiety disorder: Secondary | ICD-10-CM | POA: Diagnosis not present

## 2016-12-09 DIAGNOSIS — F411 Generalized anxiety disorder: Secondary | ICD-10-CM | POA: Diagnosis not present

## 2016-12-22 DIAGNOSIS — F411 Generalized anxiety disorder: Secondary | ICD-10-CM | POA: Diagnosis not present

## 2017-01-06 DIAGNOSIS — F411 Generalized anxiety disorder: Secondary | ICD-10-CM | POA: Diagnosis not present

## 2017-01-21 DIAGNOSIS — F411 Generalized anxiety disorder: Secondary | ICD-10-CM | POA: Diagnosis not present

## 2017-02-11 ENCOUNTER — Other Ambulatory Visit: Payer: Self-pay | Admitting: Obstetrics and Gynecology

## 2017-02-11 DIAGNOSIS — F411 Generalized anxiety disorder: Secondary | ICD-10-CM | POA: Diagnosis not present

## 2017-02-11 DIAGNOSIS — Z1231 Encounter for screening mammogram for malignant neoplasm of breast: Secondary | ICD-10-CM

## 2017-02-24 DIAGNOSIS — F411 Generalized anxiety disorder: Secondary | ICD-10-CM | POA: Diagnosis not present

## 2017-03-17 DIAGNOSIS — F411 Generalized anxiety disorder: Secondary | ICD-10-CM | POA: Diagnosis not present

## 2017-03-28 ENCOUNTER — Ambulatory Visit
Admission: RE | Admit: 2017-03-28 | Discharge: 2017-03-28 | Disposition: A | Discharge status: Home / Self Care | Payer: Federal, State, Local not specified - PPO | Source: Ambulatory Visit | Attending: Obstetrics and Gynecology | Admitting: Obstetrics and Gynecology

## 2017-03-28 DIAGNOSIS — Z1231 Encounter for screening mammogram for malignant neoplasm of breast: Secondary | ICD-10-CM

## 2017-04-07 DIAGNOSIS — F411 Generalized anxiety disorder: Secondary | ICD-10-CM | POA: Diagnosis not present

## 2017-04-11 DIAGNOSIS — R7989 Other specified abnormal findings of blood chemistry: Secondary | ICD-10-CM | POA: Diagnosis not present

## 2017-04-11 DIAGNOSIS — Z01411 Encounter for gynecological examination (general) (routine) with abnormal findings: Secondary | ICD-10-CM | POA: Diagnosis not present

## 2017-04-11 DIAGNOSIS — Z6823 Body mass index (BMI) 23.0-23.9, adult: Secondary | ICD-10-CM | POA: Diagnosis not present

## 2017-04-18 DIAGNOSIS — N951 Menopausal and female climacteric states: Secondary | ICD-10-CM | POA: Diagnosis not present

## 2017-05-21 DIAGNOSIS — F411 Generalized anxiety disorder: Secondary | ICD-10-CM | POA: Diagnosis not present

## 2017-07-02 DIAGNOSIS — F411 Generalized anxiety disorder: Secondary | ICD-10-CM | POA: Diagnosis not present

## 2017-07-26 DIAGNOSIS — R7989 Other specified abnormal findings of blood chemistry: Secondary | ICD-10-CM | POA: Diagnosis not present

## 2017-08-01 DIAGNOSIS — N951 Menopausal and female climacteric states: Secondary | ICD-10-CM | POA: Diagnosis not present

## 2017-08-06 DIAGNOSIS — F411 Generalized anxiety disorder: Secondary | ICD-10-CM | POA: Diagnosis not present

## 2017-09-03 DIAGNOSIS — F411 Generalized anxiety disorder: Secondary | ICD-10-CM | POA: Diagnosis not present

## 2017-10-01 DIAGNOSIS — F411 Generalized anxiety disorder: Secondary | ICD-10-CM | POA: Diagnosis not present

## 2017-11-12 DIAGNOSIS — F411 Generalized anxiety disorder: Secondary | ICD-10-CM | POA: Diagnosis not present

## 2017-12-12 DIAGNOSIS — F411 Generalized anxiety disorder: Secondary | ICD-10-CM | POA: Diagnosis not present

## 2017-12-28 DIAGNOSIS — R7989 Other specified abnormal findings of blood chemistry: Secondary | ICD-10-CM | POA: Diagnosis not present

## 2018-01-04 DIAGNOSIS — N951 Menopausal and female climacteric states: Secondary | ICD-10-CM | POA: Diagnosis not present

## 2018-01-07 DIAGNOSIS — F411 Generalized anxiety disorder: Secondary | ICD-10-CM | POA: Diagnosis not present

## 2018-03-02 ENCOUNTER — Other Ambulatory Visit: Payer: Self-pay | Admitting: Obstetrics and Gynecology

## 2018-03-02 DIAGNOSIS — Z1231 Encounter for screening mammogram for malignant neoplasm of breast: Secondary | ICD-10-CM

## 2018-04-03 ENCOUNTER — Ambulatory Visit
Admission: RE | Admit: 2018-04-03 | Discharge: 2018-04-03 | Disposition: A | Discharge status: Home / Self Care | Payer: Federal, State, Local not specified - PPO | Source: Ambulatory Visit | Attending: Obstetrics and Gynecology | Admitting: Obstetrics and Gynecology

## 2018-04-03 DIAGNOSIS — Z1231 Encounter for screening mammogram for malignant neoplasm of breast: Secondary | ICD-10-CM

## 2018-04-14 DIAGNOSIS — Z01411 Encounter for gynecological examination (general) (routine) with abnormal findings: Secondary | ICD-10-CM | POA: Diagnosis not present

## 2018-04-14 DIAGNOSIS — Z6823 Body mass index (BMI) 23.0-23.9, adult: Secondary | ICD-10-CM | POA: Diagnosis not present

## 2018-06-29 DIAGNOSIS — R7989 Other specified abnormal findings of blood chemistry: Secondary | ICD-10-CM | POA: Diagnosis not present

## 2018-06-29 DIAGNOSIS — R5383 Other fatigue: Secondary | ICD-10-CM | POA: Diagnosis not present

## 2018-07-06 DIAGNOSIS — N951 Menopausal and female climacteric states: Secondary | ICD-10-CM | POA: Diagnosis not present

## 2018-08-11 DIAGNOSIS — L723 Sebaceous cyst: Secondary | ICD-10-CM | POA: Diagnosis not present

## 2018-08-14 ENCOUNTER — Ambulatory Visit: Payer: Federal, State, Local not specified - PPO | Admitting: Podiatry

## 2018-08-14 ENCOUNTER — Ambulatory Visit (INDEPENDENT_AMBULATORY_CARE_PROVIDER_SITE_OTHER): Payer: Federal, State, Local not specified - PPO

## 2018-08-14 ENCOUNTER — Encounter: Payer: Self-pay | Admitting: Podiatry

## 2018-08-14 VITALS — BP 131/75

## 2018-08-14 DIAGNOSIS — R102 Pelvic and perineal pain: Secondary | ICD-10-CM | POA: Insufficient documentation

## 2018-08-14 DIAGNOSIS — M7750 Other enthesopathy of unspecified foot: Secondary | ICD-10-CM | POA: Diagnosis not present

## 2018-08-14 DIAGNOSIS — M2041 Other hammer toe(s) (acquired), right foot: Secondary | ICD-10-CM

## 2018-08-14 DIAGNOSIS — D1800 Hemangioma unspecified site: Secondary | ICD-10-CM | POA: Insufficient documentation

## 2018-08-14 DIAGNOSIS — R8290 Unspecified abnormal findings in urine: Secondary | ICD-10-CM | POA: Insufficient documentation

## 2018-08-14 DIAGNOSIS — M2042 Other hammer toe(s) (acquired), left foot: Secondary | ICD-10-CM

## 2018-08-14 DIAGNOSIS — R339 Retention of urine, unspecified: Secondary | ICD-10-CM | POA: Insufficient documentation

## 2018-08-20 NOTE — Progress Notes (Signed)
   Subjective: 58 year old female presenting today as a new patient with a chief complaint of bunions and hammertoes bilaterally that have been symptomatic for the past month. She also reports associated corns on the left foot. She denies any modifying factors and has not done anything for treatment at home. She states she had bunion surgery two years ago. Patient is here for further evaluation and treatment.   Past Medical History:  Diagnosis Date  . Anemia   . Leg swelling   . MVP (mitral valve prolapse)   . No pertinent past medical history   . Pinched nerve in neck      Objective:  Physical Exam General: Alert and oriented x3 in no acute distress  Dermatology: Hyperkeratotic lesion present on the left 2nd toe. Pain on palpation with a central nucleated core noted. Skin is warm, dry and supple bilateral lower extremities. Negative for open lesions or macerations.  Vascular: Palpable pedal pulses bilaterally. No edema or erythema noted. Capillary refill within normal limits.  Neurological: Epicritic and protective threshold grossly intact bilaterally.   Musculoskeletal Exam: Pain on palpation at the keratotic lesion noted. Range of motion within normal limits bilateral. Muscle strength 5/5 in all groups bilateral.  Radiographic Exam:  Bone spur of the left 2nd digit noted to the medial aspect of the middle phalanx. Joint spaces preserved. No fracture/dislocation/boney destruction.     Assessment: 1. Bone spur left second toe 2. Overlying callus lesion left 2nd toe   Plan of Care:  1. Patient evaluated. X-Rays reviewed.  2. Excisional debridement of keratoic lesion using a chisel blade was performed without incident.  3. Dressed area with light dressing. 4. Silicone toe cushions dispensed.  5. Recommended good shoe gear.  6. Patient will likely need surgery at some point.  7. Patient is to return to the clinic PRN.   Patient does kickboxing and competes in track and  field.   Edrick Kins, DPM [facility]  Dr. Edrick Kins, [address]                                        [place], [place] [address]                Office [phone]  Fax [phone]

## 2018-09-08 DIAGNOSIS — Z0000 Encounter for general adult medical examination without abnormal findings: Secondary | ICD-10-CM | POA: Diagnosis not present

## 2018-09-08 DIAGNOSIS — R3915 Urgency of urination: Secondary | ICD-10-CM | POA: Diagnosis not present

## 2018-11-20 DIAGNOSIS — R5383 Other fatigue: Secondary | ICD-10-CM | POA: Diagnosis not present

## 2018-11-20 DIAGNOSIS — E559 Vitamin D deficiency, unspecified: Secondary | ICD-10-CM | POA: Diagnosis not present

## 2018-11-20 DIAGNOSIS — R7989 Other specified abnormal findings of blood chemistry: Secondary | ICD-10-CM | POA: Diagnosis not present

## 2018-11-20 DIAGNOSIS — N951 Menopausal and female climacteric states: Secondary | ICD-10-CM | POA: Diagnosis not present

## 2018-11-23 DIAGNOSIS — N951 Menopausal and female climacteric states: Secondary | ICD-10-CM | POA: Diagnosis not present

## 2019-03-06 ENCOUNTER — Other Ambulatory Visit: Payer: Self-pay | Admitting: Obstetrics and Gynecology

## 2019-03-06 ENCOUNTER — Other Ambulatory Visit: Payer: Self-pay | Admitting: Family Medicine

## 2019-03-06 DIAGNOSIS — Z1231 Encounter for screening mammogram for malignant neoplasm of breast: Secondary | ICD-10-CM

## 2019-04-19 ENCOUNTER — Other Ambulatory Visit: Payer: Self-pay

## 2019-04-19 ENCOUNTER — Ambulatory Visit
Admission: RE | Admit: 2019-04-19 | Discharge: 2019-04-19 | Disposition: A | Discharge status: Home / Self Care | Payer: Federal, State, Local not specified - PPO | Source: Ambulatory Visit | Attending: Obstetrics and Gynecology | Admitting: Obstetrics and Gynecology

## 2019-04-19 DIAGNOSIS — Z1231 Encounter for screening mammogram for malignant neoplasm of breast: Secondary | ICD-10-CM

## 2019-05-18 DIAGNOSIS — Z23 Encounter for immunization: Secondary | ICD-10-CM | POA: Diagnosis not present

## 2019-05-18 DIAGNOSIS — N471 Phimosis: Secondary | ICD-10-CM | POA: Diagnosis not present

## 2019-05-18 DIAGNOSIS — Z051 Observation and evaluation of newborn for suspected infectious condition ruled out: Secondary | ICD-10-CM | POA: Diagnosis not present

## 2019-05-21 DIAGNOSIS — Z6826 Body mass index (BMI) 26.0-26.9, adult: Secondary | ICD-10-CM | POA: Diagnosis not present

## 2019-05-21 DIAGNOSIS — Z1272 Encounter for screening for malignant neoplasm of vagina: Secondary | ICD-10-CM | POA: Diagnosis not present

## 2019-05-21 DIAGNOSIS — Z124 Encounter for screening for malignant neoplasm of cervix: Secondary | ICD-10-CM | POA: Diagnosis not present

## 2019-05-21 DIAGNOSIS — Z01419 Encounter for gynecological examination (general) (routine) without abnormal findings: Secondary | ICD-10-CM | POA: Diagnosis not present

## 2019-07-24 DIAGNOSIS — N951 Menopausal and female climacteric states: Secondary | ICD-10-CM | POA: Diagnosis not present

## 2019-07-24 DIAGNOSIS — R7989 Other specified abnormal findings of blood chemistry: Secondary | ICD-10-CM | POA: Diagnosis not present

## 2019-07-24 DIAGNOSIS — E559 Vitamin D deficiency, unspecified: Secondary | ICD-10-CM | POA: Diagnosis not present

## 2019-07-24 DIAGNOSIS — R5383 Other fatigue: Secondary | ICD-10-CM | POA: Diagnosis not present

## 2019-07-30 DIAGNOSIS — N951 Menopausal and female climacteric states: Secondary | ICD-10-CM | POA: Diagnosis not present

## 2019-09-18 ENCOUNTER — Other Ambulatory Visit: Payer: Self-pay | Admitting: Sports Medicine

## 2019-10-01 DIAGNOSIS — D649 Anemia, unspecified: Secondary | ICD-10-CM | POA: Diagnosis not present

## 2019-10-01 DIAGNOSIS — Z13220 Encounter for screening for lipoid disorders: Secondary | ICD-10-CM | POA: Diagnosis not present

## 2019-10-01 DIAGNOSIS — Z0000 Encounter for general adult medical examination without abnormal findings: Secondary | ICD-10-CM | POA: Diagnosis not present

## 2019-10-24 DIAGNOSIS — F419 Anxiety disorder, unspecified: Secondary | ICD-10-CM | POA: Diagnosis not present

## 2019-11-08 DIAGNOSIS — F419 Anxiety disorder, unspecified: Secondary | ICD-10-CM | POA: Diagnosis not present

## 2019-11-12 DIAGNOSIS — N951 Menopausal and female climacteric states: Secondary | ICD-10-CM | POA: Diagnosis not present

## 2020-02-19 DIAGNOSIS — N951 Menopausal and female climacteric states: Secondary | ICD-10-CM | POA: Diagnosis not present

## 2020-02-19 DIAGNOSIS — E559 Vitamin D deficiency, unspecified: Secondary | ICD-10-CM | POA: Diagnosis not present

## 2020-02-19 DIAGNOSIS — R7989 Other specified abnormal findings of blood chemistry: Secondary | ICD-10-CM | POA: Diagnosis not present

## 2020-02-19 DIAGNOSIS — R5383 Other fatigue: Secondary | ICD-10-CM | POA: Diagnosis not present

## 2020-02-27 DIAGNOSIS — N951 Menopausal and female climacteric states: Secondary | ICD-10-CM | POA: Diagnosis not present

## 2020-03-10 DIAGNOSIS — D692 Other nonthrombocytopenic purpura: Secondary | ICD-10-CM | POA: Diagnosis not present

## 2020-03-10 DIAGNOSIS — L603 Nail dystrophy: Secondary | ICD-10-CM | POA: Diagnosis not present

## 2020-03-17 ENCOUNTER — Other Ambulatory Visit: Payer: Self-pay | Admitting: Obstetrics and Gynecology

## 2020-03-17 DIAGNOSIS — Z1231 Encounter for screening mammogram for malignant neoplasm of breast: Secondary | ICD-10-CM

## 2020-04-11 DIAGNOSIS — Z1159 Encounter for screening for other viral diseases: Secondary | ICD-10-CM | POA: Diagnosis not present

## 2020-04-16 DIAGNOSIS — K635 Polyp of colon: Secondary | ICD-10-CM | POA: Diagnosis not present

## 2020-04-16 DIAGNOSIS — Z1211 Encounter for screening for malignant neoplasm of colon: Secondary | ICD-10-CM | POA: Diagnosis not present

## 2020-04-16 DIAGNOSIS — K5140 Inflammatory polyps of colon without complications: Secondary | ICD-10-CM | POA: Diagnosis not present

## 2020-04-21 ENCOUNTER — Ambulatory Visit
Admission: RE | Admit: 2020-04-21 | Discharge: 2020-04-21 | Disposition: A | Discharge status: Home / Self Care | Payer: Federal, State, Local not specified - PPO | Source: Ambulatory Visit | Attending: Obstetrics and Gynecology | Admitting: Obstetrics and Gynecology

## 2020-04-21 ENCOUNTER — Other Ambulatory Visit: Payer: Self-pay

## 2020-04-21 DIAGNOSIS — Z1231 Encounter for screening mammogram for malignant neoplasm of breast: Secondary | ICD-10-CM | POA: Diagnosis not present

## 2020-06-09 DIAGNOSIS — F4323 Adjustment disorder with mixed anxiety and depressed mood: Secondary | ICD-10-CM | POA: Diagnosis not present

## 2020-06-17 DIAGNOSIS — N951 Menopausal and female climacteric states: Secondary | ICD-10-CM | POA: Diagnosis not present

## 2020-06-17 DIAGNOSIS — Z01419 Encounter for gynecological examination (general) (routine) without abnormal findings: Secondary | ICD-10-CM | POA: Diagnosis not present

## 2020-06-17 DIAGNOSIS — Z6826 Body mass index (BMI) 26.0-26.9, adult: Secondary | ICD-10-CM | POA: Diagnosis not present

## 2020-06-17 DIAGNOSIS — Z124 Encounter for screening for malignant neoplasm of cervix: Secondary | ICD-10-CM | POA: Diagnosis not present

## 2020-06-25 DIAGNOSIS — F4323 Adjustment disorder with mixed anxiety and depressed mood: Secondary | ICD-10-CM | POA: Diagnosis not present

## 2020-07-04 DIAGNOSIS — M8589 Other specified disorders of bone density and structure, multiple sites: Secondary | ICD-10-CM | POA: Diagnosis not present

## 2020-07-25 DIAGNOSIS — F4323 Adjustment disorder with mixed anxiety and depressed mood: Secondary | ICD-10-CM | POA: Diagnosis not present

## 2020-08-06 DIAGNOSIS — R7989 Other specified abnormal findings of blood chemistry: Secondary | ICD-10-CM | POA: Diagnosis not present

## 2020-08-06 DIAGNOSIS — N951 Menopausal and female climacteric states: Secondary | ICD-10-CM | POA: Diagnosis not present

## 2020-08-06 DIAGNOSIS — R5383 Other fatigue: Secondary | ICD-10-CM | POA: Diagnosis not present

## 2020-08-06 DIAGNOSIS — E559 Vitamin D deficiency, unspecified: Secondary | ICD-10-CM | POA: Diagnosis not present

## 2020-08-07 DIAGNOSIS — F4323 Adjustment disorder with mixed anxiety and depressed mood: Secondary | ICD-10-CM | POA: Diagnosis not present

## 2020-08-13 DIAGNOSIS — N951 Menopausal and female climacteric states: Secondary | ICD-10-CM | POA: Diagnosis not present

## 2020-08-21 DIAGNOSIS — F4323 Adjustment disorder with mixed anxiety and depressed mood: Secondary | ICD-10-CM | POA: Diagnosis not present

## 2020-09-24 DIAGNOSIS — F4323 Adjustment disorder with mixed anxiety and depressed mood: Secondary | ICD-10-CM | POA: Diagnosis not present

## 2020-10-02 DIAGNOSIS — Z0000 Encounter for general adult medical examination without abnormal findings: Secondary | ICD-10-CM | POA: Diagnosis not present

## 2020-10-09 DIAGNOSIS — F4323 Adjustment disorder with mixed anxiety and depressed mood: Secondary | ICD-10-CM | POA: Diagnosis not present

## 2020-10-27 DIAGNOSIS — F4323 Adjustment disorder with mixed anxiety and depressed mood: Secondary | ICD-10-CM | POA: Diagnosis not present

## 2020-11-10 DIAGNOSIS — F4323 Adjustment disorder with mixed anxiety and depressed mood: Secondary | ICD-10-CM | POA: Diagnosis not present

## 2020-11-13 DIAGNOSIS — R5383 Other fatigue: Secondary | ICD-10-CM | POA: Diagnosis not present

## 2020-11-13 DIAGNOSIS — R7989 Other specified abnormal findings of blood chemistry: Secondary | ICD-10-CM | POA: Diagnosis not present

## 2020-11-13 DIAGNOSIS — N951 Menopausal and female climacteric states: Secondary | ICD-10-CM | POA: Diagnosis not present

## 2020-11-13 DIAGNOSIS — E559 Vitamin D deficiency, unspecified: Secondary | ICD-10-CM | POA: Diagnosis not present

## 2020-11-19 DIAGNOSIS — N951 Menopausal and female climacteric states: Secondary | ICD-10-CM | POA: Diagnosis not present

## 2020-12-01 DIAGNOSIS — F4323 Adjustment disorder with mixed anxiety and depressed mood: Secondary | ICD-10-CM | POA: Diagnosis not present

## 2020-12-22 DIAGNOSIS — F4323 Adjustment disorder with mixed anxiety and depressed mood: Secondary | ICD-10-CM | POA: Diagnosis not present

## 2021-01-01 DIAGNOSIS — L988 Other specified disorders of the skin and subcutaneous tissue: Secondary | ICD-10-CM | POA: Diagnosis not present

## 2021-01-01 DIAGNOSIS — L821 Other seborrheic keratosis: Secondary | ICD-10-CM | POA: Diagnosis not present

## 2021-01-12 DIAGNOSIS — F4323 Adjustment disorder with mixed anxiety and depressed mood: Secondary | ICD-10-CM | POA: Diagnosis not present

## 2021-02-02 DIAGNOSIS — F4323 Adjustment disorder with mixed anxiety and depressed mood: Secondary | ICD-10-CM | POA: Diagnosis not present

## 2021-03-02 DIAGNOSIS — F4323 Adjustment disorder with mixed anxiety and depressed mood: Secondary | ICD-10-CM | POA: Diagnosis not present

## 2021-03-09 ENCOUNTER — Other Ambulatory Visit: Payer: Self-pay | Admitting: Family Medicine

## 2021-03-09 DIAGNOSIS — Z1231 Encounter for screening mammogram for malignant neoplasm of breast: Secondary | ICD-10-CM

## 2021-03-16 DIAGNOSIS — F4323 Adjustment disorder with mixed anxiety and depressed mood: Secondary | ICD-10-CM | POA: Diagnosis not present

## 2021-03-27 DIAGNOSIS — N951 Menopausal and female climacteric states: Secondary | ICD-10-CM | POA: Diagnosis not present

## 2021-03-27 DIAGNOSIS — E559 Vitamin D deficiency, unspecified: Secondary | ICD-10-CM | POA: Diagnosis not present

## 2021-03-27 DIAGNOSIS — R7989 Other specified abnormal findings of blood chemistry: Secondary | ICD-10-CM | POA: Diagnosis not present

## 2021-03-27 DIAGNOSIS — R5383 Other fatigue: Secondary | ICD-10-CM | POA: Diagnosis not present

## 2021-04-06 DIAGNOSIS — N951 Menopausal and female climacteric states: Secondary | ICD-10-CM | POA: Diagnosis not present

## 2021-04-06 DIAGNOSIS — F4323 Adjustment disorder with mixed anxiety and depressed mood: Secondary | ICD-10-CM | POA: Diagnosis not present

## 2021-04-24 ENCOUNTER — Other Ambulatory Visit: Payer: Self-pay

## 2021-04-24 ENCOUNTER — Ambulatory Visit
Admission: RE | Admit: 2021-04-24 | Discharge: 2021-04-24 | Disposition: A | Discharge status: Home / Self Care | Payer: Federal, State, Local not specified - PPO | Source: Ambulatory Visit

## 2021-04-24 DIAGNOSIS — Z1231 Encounter for screening mammogram for malignant neoplasm of breast: Secondary | ICD-10-CM

## 2021-05-04 DIAGNOSIS — F4323 Adjustment disorder with mixed anxiety and depressed mood: Secondary | ICD-10-CM | POA: Diagnosis not present

## 2021-06-08 DIAGNOSIS — F4323 Adjustment disorder with mixed anxiety and depressed mood: Secondary | ICD-10-CM | POA: Diagnosis not present

## 2021-07-02 DIAGNOSIS — Z01419 Encounter for gynecological examination (general) (routine) without abnormal findings: Secondary | ICD-10-CM | POA: Diagnosis not present

## 2021-07-06 DIAGNOSIS — F4323 Adjustment disorder with mixed anxiety and depressed mood: Secondary | ICD-10-CM | POA: Diagnosis not present

## 2021-07-27 DIAGNOSIS — E559 Vitamin D deficiency, unspecified: Secondary | ICD-10-CM | POA: Diagnosis not present

## 2021-07-27 DIAGNOSIS — R7989 Other specified abnormal findings of blood chemistry: Secondary | ICD-10-CM | POA: Diagnosis not present

## 2021-07-27 DIAGNOSIS — E785 Hyperlipidemia, unspecified: Secondary | ICD-10-CM | POA: Diagnosis not present

## 2021-07-27 DIAGNOSIS — R5383 Other fatigue: Secondary | ICD-10-CM | POA: Diagnosis not present

## 2021-10-05 IMAGING — MG DIGITAL SCREENING BILAT W/ TOMO W/ CAD
8 series · 8 of 24 positions shown · non-contrast
Comparison: Previous exam(s).

CLINICAL DATA: Screening.

EXAM:
DIGITAL SCREENING BILATERAL MAMMOGRAM WITH TOMO AND CAD

[R MLO synth-2D]
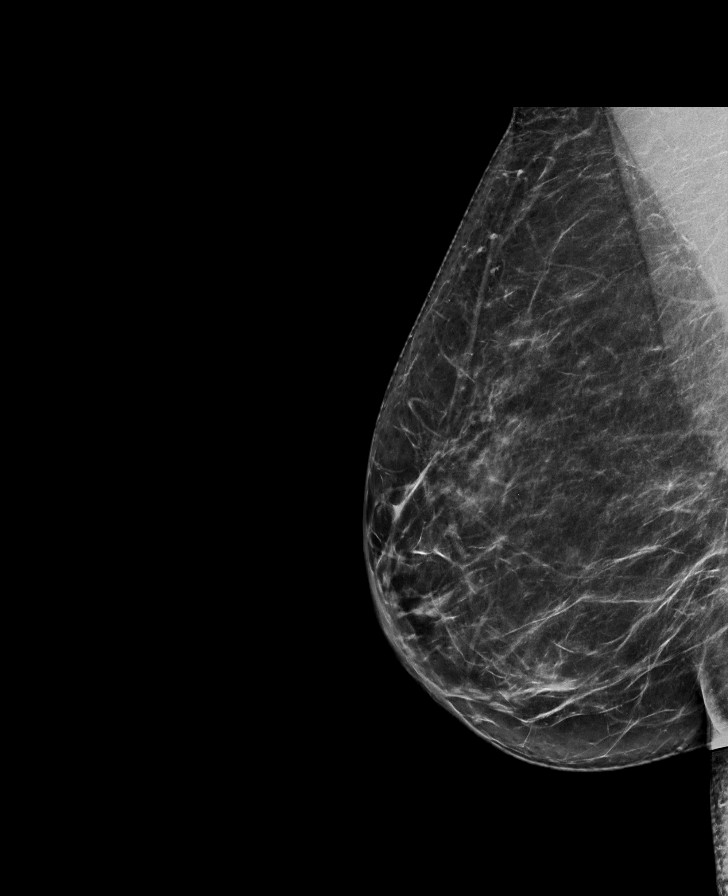

[R CC synth-2D]
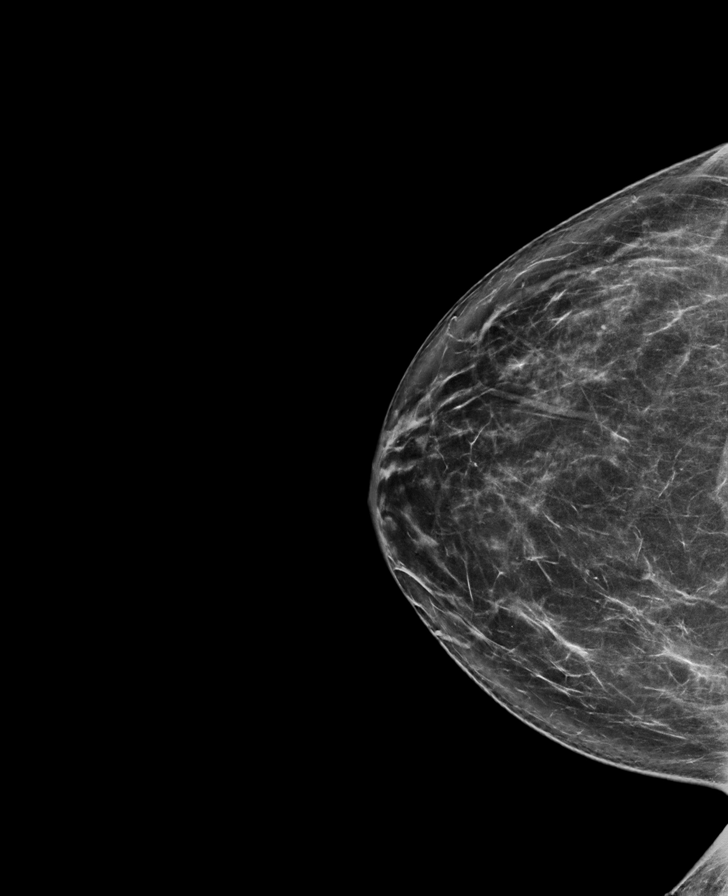

[L CC synth-2D]
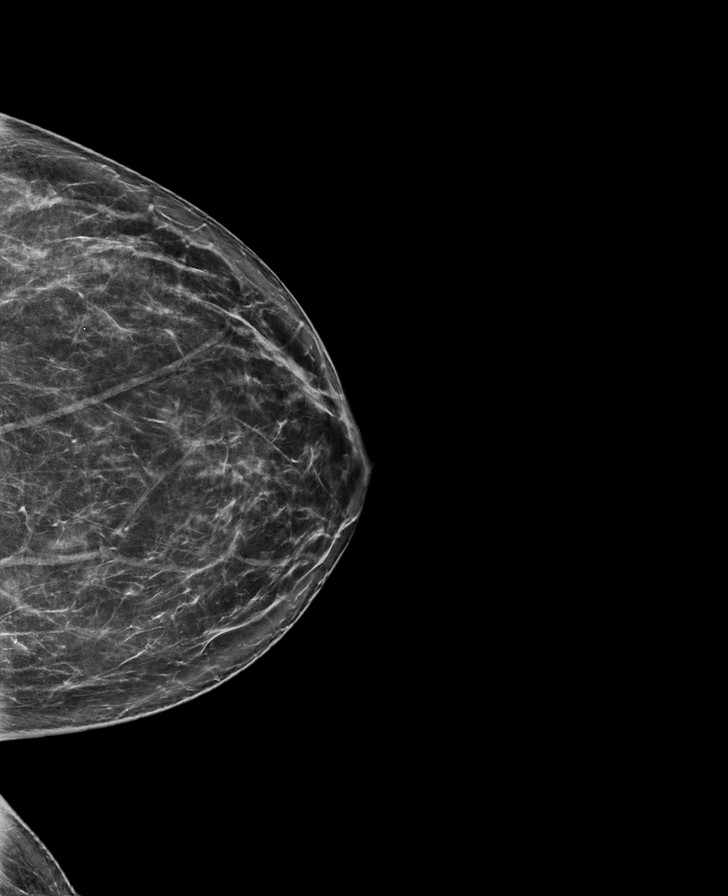

[L MLO synth-2D]
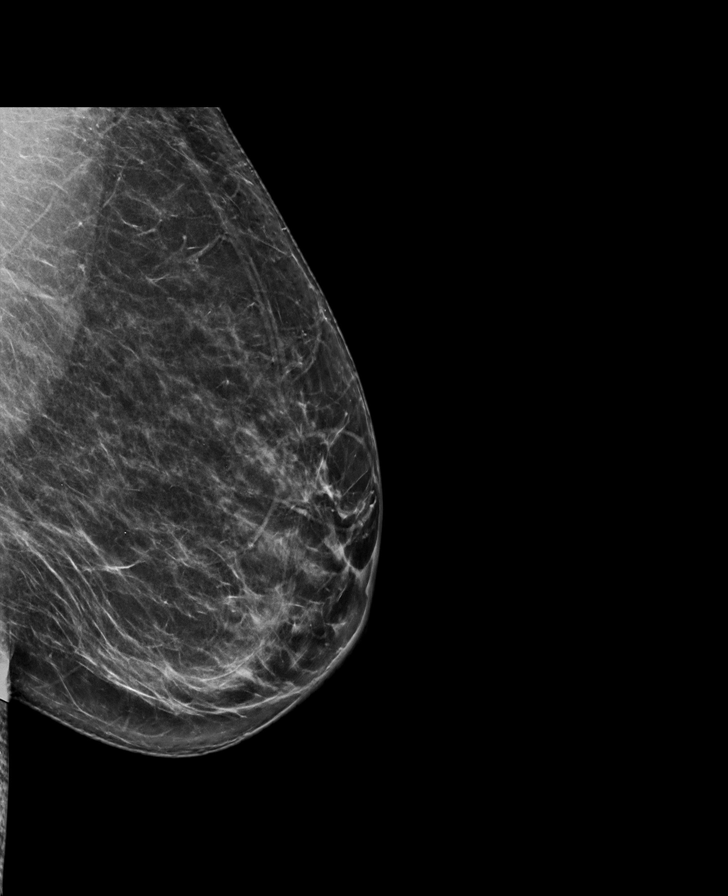

[R MLO tomo · tomo slice 41/81.0]
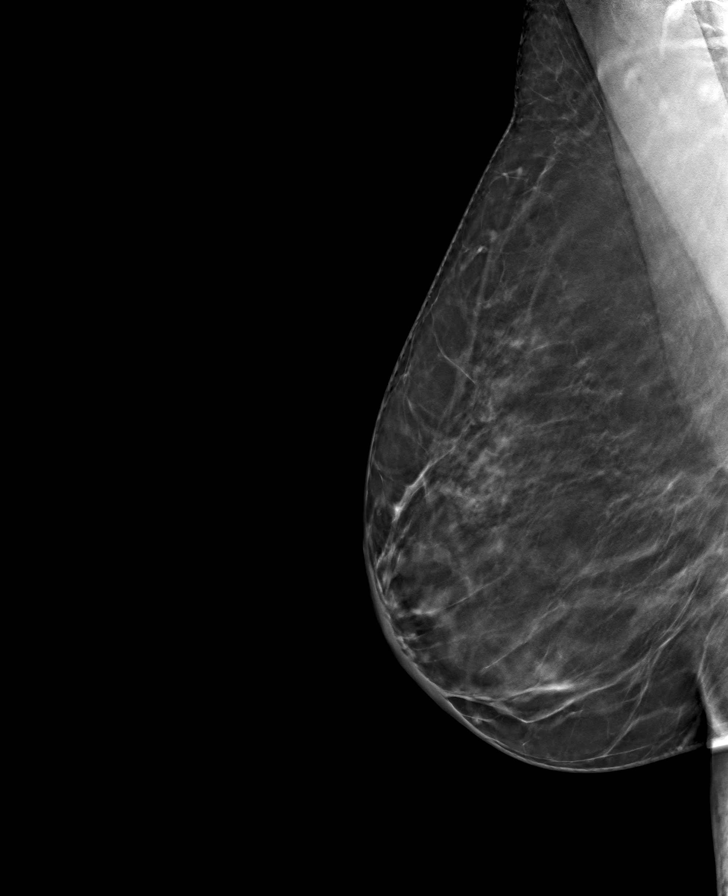

[L CC tomo · tomo slice 41/80.0]
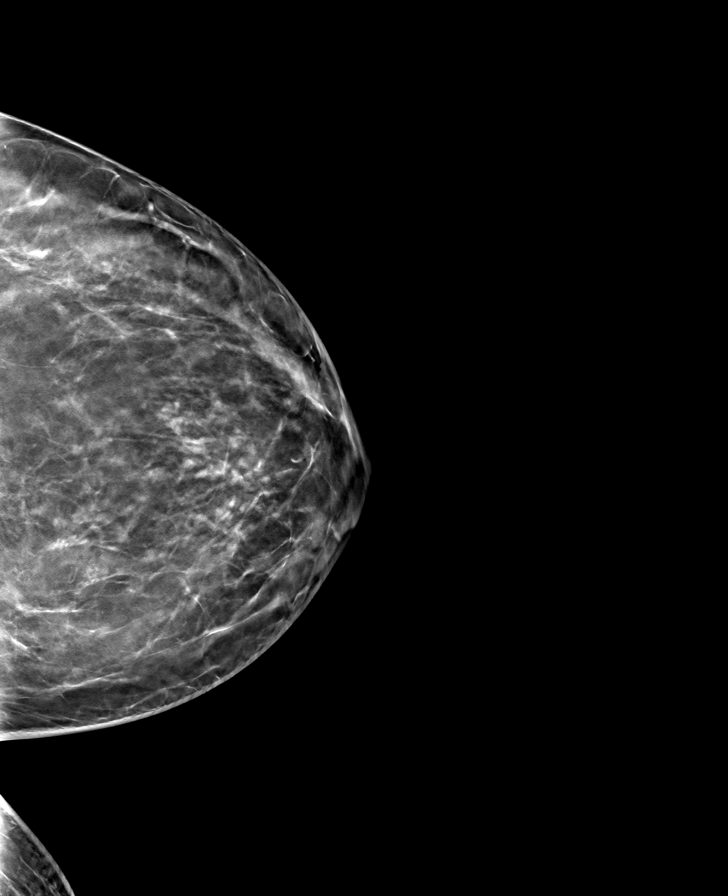

[L MLO tomo · tomo slice 39/76.0]
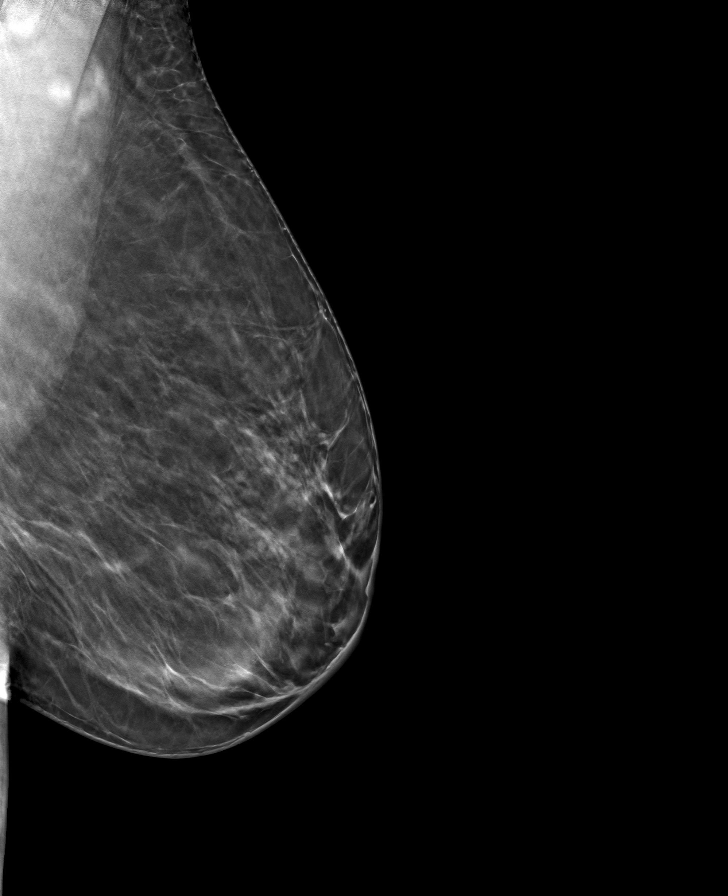

[R CC tomo · tomo slice 42/83.0]
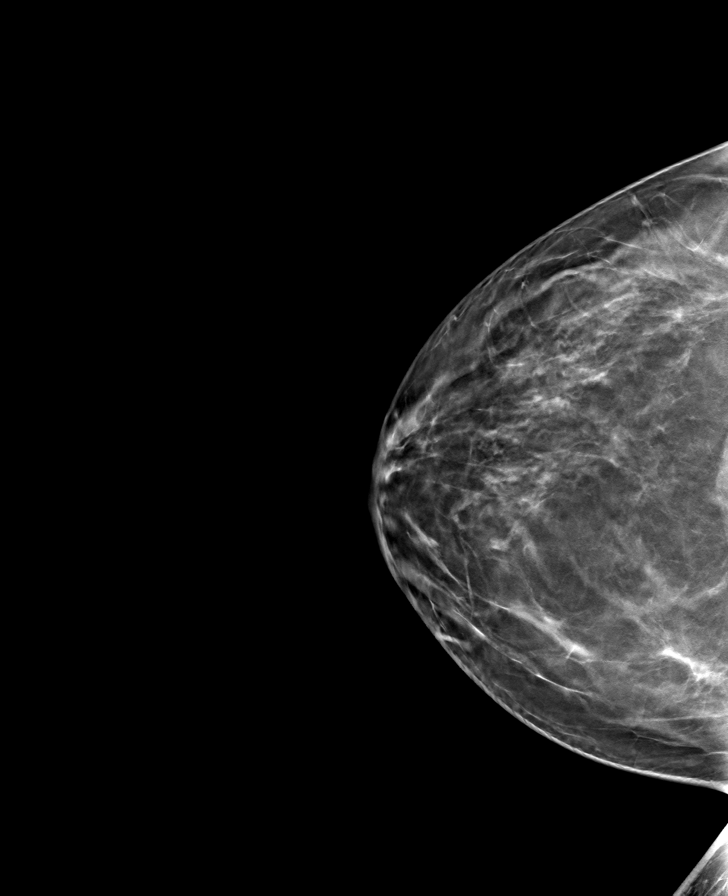

[8 of 24 positions shown; findings below may reference images not displayed]

ACR Breast Density Category b: There are scattered areas of
fibroglandular density.
FINDINGS: There are no findings suspicious for malignancy. Images were
processed with CAD.
IMPRESSION: No mammographic evidence of malignancy. A result letter of this
screening mammogram will be mailed directly to the patient.

RECOMMENDATION:
Screening mammogram in one year. (Code:[id number])

BI-RADS CATEGORY  1: Negative.

## 2021-10-12 DIAGNOSIS — Z0000 Encounter for general adult medical examination without abnormal findings: Secondary | ICD-10-CM | POA: Diagnosis not present

## 2021-10-12 DIAGNOSIS — H18513 Endothelial corneal dystrophy, bilateral: Secondary | ICD-10-CM | POA: Diagnosis not present

## 2021-11-05 DIAGNOSIS — R519 Headache, unspecified: Secondary | ICD-10-CM | POA: Diagnosis not present

## 2022-01-27 DIAGNOSIS — E785 Hyperlipidemia, unspecified: Secondary | ICD-10-CM | POA: Diagnosis not present

## 2022-01-27 DIAGNOSIS — R7989 Other specified abnormal findings of blood chemistry: Secondary | ICD-10-CM | POA: Diagnosis not present

## 2022-01-27 DIAGNOSIS — R5383 Other fatigue: Secondary | ICD-10-CM | POA: Diagnosis not present

## 2022-01-27 DIAGNOSIS — E559 Vitamin D deficiency, unspecified: Secondary | ICD-10-CM | POA: Diagnosis not present

## 2022-02-01 DIAGNOSIS — N951 Menopausal and female climacteric states: Secondary | ICD-10-CM | POA: Diagnosis not present

## 2022-06-04 ENCOUNTER — Ambulatory Visit
Admission: RE | Admit: 2022-06-04 | Discharge: 2022-06-04 | Disposition: A | Discharge status: Home / Self Care | Payer: Federal, State, Local not specified - PPO | Source: Ambulatory Visit | Attending: Internal Medicine | Admitting: Internal Medicine

## 2022-06-04 ENCOUNTER — Other Ambulatory Visit: Payer: Self-pay | Admitting: Internal Medicine

## 2022-06-04 DIAGNOSIS — Z1231 Encounter for screening mammogram for malignant neoplasm of breast: Secondary | ICD-10-CM

## 2022-07-21 DIAGNOSIS — E785 Hyperlipidemia, unspecified: Secondary | ICD-10-CM | POA: Diagnosis not present

## 2022-07-21 DIAGNOSIS — E559 Vitamin D deficiency, unspecified: Secondary | ICD-10-CM | POA: Diagnosis not present

## 2022-07-21 DIAGNOSIS — R7989 Other specified abnormal findings of blood chemistry: Secondary | ICD-10-CM | POA: Diagnosis not present

## 2022-07-21 DIAGNOSIS — R5383 Other fatigue: Secondary | ICD-10-CM | POA: Diagnosis not present

## 2022-07-23 DIAGNOSIS — F411 Generalized anxiety disorder: Secondary | ICD-10-CM | POA: Diagnosis not present

## 2022-07-29 DIAGNOSIS — N951 Menopausal and female climacteric states: Secondary | ICD-10-CM | POA: Diagnosis not present

## 2022-07-30 DIAGNOSIS — F411 Generalized anxiety disorder: Secondary | ICD-10-CM | POA: Diagnosis not present

## 2022-08-05 DIAGNOSIS — M8580 Other specified disorders of bone density and structure, unspecified site: Secondary | ICD-10-CM | POA: Diagnosis not present

## 2022-08-05 DIAGNOSIS — Z01419 Encounter for gynecological examination (general) (routine) without abnormal findings: Secondary | ICD-10-CM | POA: Diagnosis not present

## 2022-08-06 DIAGNOSIS — F411 Generalized anxiety disorder: Secondary | ICD-10-CM | POA: Diagnosis not present

## 2022-08-13 DIAGNOSIS — F411 Generalized anxiety disorder: Secondary | ICD-10-CM | POA: Diagnosis not present

## 2022-08-17 ENCOUNTER — Other Ambulatory Visit: Payer: Self-pay | Admitting: Obstetrics and Gynecology

## 2022-08-17 DIAGNOSIS — M8580 Other specified disorders of bone density and structure, unspecified site: Secondary | ICD-10-CM

## 2022-08-20 DIAGNOSIS — F411 Generalized anxiety disorder: Secondary | ICD-10-CM | POA: Diagnosis not present

## 2022-08-27 DIAGNOSIS — F411 Generalized anxiety disorder: Secondary | ICD-10-CM | POA: Diagnosis not present

## 2022-09-03 DIAGNOSIS — F411 Generalized anxiety disorder: Secondary | ICD-10-CM | POA: Diagnosis not present

## 2022-09-17 DIAGNOSIS — F411 Generalized anxiety disorder: Secondary | ICD-10-CM | POA: Diagnosis not present

## 2022-09-24 DIAGNOSIS — F411 Generalized anxiety disorder: Secondary | ICD-10-CM | POA: Diagnosis not present

## 2022-10-01 DIAGNOSIS — F411 Generalized anxiety disorder: Secondary | ICD-10-CM | POA: Diagnosis not present

## 2022-10-08 DIAGNOSIS — F411 Generalized anxiety disorder: Secondary | ICD-10-CM | POA: Diagnosis not present

## 2022-10-15 DIAGNOSIS — F411 Generalized anxiety disorder: Secondary | ICD-10-CM | POA: Diagnosis not present

## 2022-10-22 DIAGNOSIS — F411 Generalized anxiety disorder: Secondary | ICD-10-CM | POA: Diagnosis not present

## 2022-10-28 DIAGNOSIS — Z0000 Encounter for general adult medical examination without abnormal findings: Secondary | ICD-10-CM | POA: Diagnosis not present

## 2022-10-28 DIAGNOSIS — Z23 Encounter for immunization: Secondary | ICD-10-CM | POA: Diagnosis not present

## 2022-10-29 DIAGNOSIS — F411 Generalized anxiety disorder: Secondary | ICD-10-CM | POA: Diagnosis not present

## 2022-11-03 DIAGNOSIS — E559 Vitamin D deficiency, unspecified: Secondary | ICD-10-CM | POA: Diagnosis not present

## 2022-11-03 DIAGNOSIS — N951 Menopausal and female climacteric states: Secondary | ICD-10-CM | POA: Diagnosis not present

## 2022-11-03 DIAGNOSIS — R7989 Other specified abnormal findings of blood chemistry: Secondary | ICD-10-CM | POA: Diagnosis not present

## 2022-11-03 DIAGNOSIS — E785 Hyperlipidemia, unspecified: Secondary | ICD-10-CM | POA: Diagnosis not present

## 2022-11-09 ENCOUNTER — Other Ambulatory Visit: Payer: Federal, State, Local not specified - PPO

## 2022-11-15 DIAGNOSIS — N951 Menopausal and female climacteric states: Secondary | ICD-10-CM | POA: Diagnosis not present

## 2022-11-19 DIAGNOSIS — F411 Generalized anxiety disorder: Secondary | ICD-10-CM | POA: Diagnosis not present

## 2022-11-26 DIAGNOSIS — F411 Generalized anxiety disorder: Secondary | ICD-10-CM | POA: Diagnosis not present

## 2022-12-03 DIAGNOSIS — F411 Generalized anxiety disorder: Secondary | ICD-10-CM | POA: Diagnosis not present

## 2022-12-10 DIAGNOSIS — F411 Generalized anxiety disorder: Secondary | ICD-10-CM | POA: Diagnosis not present

## 2022-12-17 DIAGNOSIS — F411 Generalized anxiety disorder: Secondary | ICD-10-CM | POA: Diagnosis not present

## 2022-12-24 DIAGNOSIS — F411 Generalized anxiety disorder: Secondary | ICD-10-CM | POA: Diagnosis not present

## 2022-12-31 DIAGNOSIS — F411 Generalized anxiety disorder: Secondary | ICD-10-CM | POA: Diagnosis not present

## 2023-01-07 DIAGNOSIS — F411 Generalized anxiety disorder: Secondary | ICD-10-CM | POA: Diagnosis not present

## 2023-01-14 DIAGNOSIS — H18513 Endothelial corneal dystrophy, bilateral: Secondary | ICD-10-CM | POA: Diagnosis not present

## 2023-03-04 DIAGNOSIS — F411 Generalized anxiety disorder: Secondary | ICD-10-CM | POA: Diagnosis not present

## 2023-03-07 DIAGNOSIS — R7989 Other specified abnormal findings of blood chemistry: Secondary | ICD-10-CM | POA: Diagnosis not present

## 2023-03-07 DIAGNOSIS — R5383 Other fatigue: Secondary | ICD-10-CM | POA: Diagnosis not present

## 2023-03-07 DIAGNOSIS — E785 Hyperlipidemia, unspecified: Secondary | ICD-10-CM | POA: Diagnosis not present

## 2023-03-07 DIAGNOSIS — E559 Vitamin D deficiency, unspecified: Secondary | ICD-10-CM | POA: Diagnosis not present

## 2023-03-11 DIAGNOSIS — F411 Generalized anxiety disorder: Secondary | ICD-10-CM | POA: Diagnosis not present

## 2023-03-14 ENCOUNTER — Ambulatory Visit
Admission: RE | Admit: 2023-03-14 | Discharge: 2023-03-14 | Disposition: A | Discharge status: Home / Self Care | Payer: Federal, State, Local not specified - PPO | Source: Ambulatory Visit | Attending: Obstetrics and Gynecology | Admitting: Obstetrics and Gynecology

## 2023-03-14 DIAGNOSIS — M8580 Other specified disorders of bone density and structure, unspecified site: Secondary | ICD-10-CM

## 2023-03-14 DIAGNOSIS — N950 Postmenopausal bleeding: Secondary | ICD-10-CM | POA: Diagnosis not present

## 2023-03-14 DIAGNOSIS — N951 Menopausal and female climacteric states: Secondary | ICD-10-CM | POA: Diagnosis not present

## 2023-03-14 DIAGNOSIS — E349 Endocrine disorder, unspecified: Secondary | ICD-10-CM | POA: Diagnosis not present

## 2023-03-18 DIAGNOSIS — F411 Generalized anxiety disorder: Secondary | ICD-10-CM | POA: Diagnosis not present

## 2023-03-25 DIAGNOSIS — F411 Generalized anxiety disorder: Secondary | ICD-10-CM | POA: Diagnosis not present

## 2023-04-01 DIAGNOSIS — F411 Generalized anxiety disorder: Secondary | ICD-10-CM | POA: Diagnosis not present

## 2023-04-08 DIAGNOSIS — F411 Generalized anxiety disorder: Secondary | ICD-10-CM | POA: Diagnosis not present

## 2023-04-22 DIAGNOSIS — F411 Generalized anxiety disorder: Secondary | ICD-10-CM | POA: Diagnosis not present

## 2023-04-25 DIAGNOSIS — H25813 Combined forms of age-related cataract, bilateral: Secondary | ICD-10-CM | POA: Diagnosis not present

## 2023-04-25 DIAGNOSIS — H18513 Endothelial corneal dystrophy, bilateral: Secondary | ICD-10-CM | POA: Diagnosis not present

## 2023-04-29 DIAGNOSIS — F411 Generalized anxiety disorder: Secondary | ICD-10-CM | POA: Diagnosis not present

## 2023-05-06 DIAGNOSIS — F411 Generalized anxiety disorder: Secondary | ICD-10-CM | POA: Diagnosis not present

## 2023-05-09 ENCOUNTER — Other Ambulatory Visit: Payer: Self-pay | Admitting: Internal Medicine

## 2023-05-09 DIAGNOSIS — Z1231 Encounter for screening mammogram for malignant neoplasm of breast: Secondary | ICD-10-CM

## 2023-05-13 DIAGNOSIS — F411 Generalized anxiety disorder: Secondary | ICD-10-CM | POA: Diagnosis not present

## 2023-05-20 DIAGNOSIS — F411 Generalized anxiety disorder: Secondary | ICD-10-CM | POA: Diagnosis not present

## 2023-06-02 DIAGNOSIS — E785 Hyperlipidemia, unspecified: Secondary | ICD-10-CM | POA: Diagnosis not present

## 2023-06-02 DIAGNOSIS — R7989 Other specified abnormal findings of blood chemistry: Secondary | ICD-10-CM | POA: Diagnosis not present

## 2023-06-02 DIAGNOSIS — R5383 Other fatigue: Secondary | ICD-10-CM | POA: Diagnosis not present

## 2023-06-02 DIAGNOSIS — E559 Vitamin D deficiency, unspecified: Secondary | ICD-10-CM | POA: Diagnosis not present

## 2023-06-03 DIAGNOSIS — F411 Generalized anxiety disorder: Secondary | ICD-10-CM | POA: Diagnosis not present

## 2023-06-06 ENCOUNTER — Ambulatory Visit
Admission: RE | Admit: 2023-06-06 | Discharge: 2023-06-06 | Disposition: A | Discharge status: Home / Self Care | Payer: Federal, State, Local not specified - PPO | Source: Ambulatory Visit | Attending: Internal Medicine | Admitting: Internal Medicine

## 2023-06-06 DIAGNOSIS — Z1231 Encounter for screening mammogram for malignant neoplasm of breast: Secondary | ICD-10-CM | POA: Diagnosis not present

## 2023-06-06 DIAGNOSIS — N951 Menopausal and female climacteric states: Secondary | ICD-10-CM | POA: Diagnosis not present

## 2023-06-09 DIAGNOSIS — H25811 Combined forms of age-related cataract, right eye: Secondary | ICD-10-CM | POA: Diagnosis not present

## 2023-06-09 DIAGNOSIS — H18511 Endothelial corneal dystrophy, right eye: Secondary | ICD-10-CM | POA: Diagnosis not present

## 2023-06-10 DIAGNOSIS — F411 Generalized anxiety disorder: Secondary | ICD-10-CM | POA: Diagnosis not present

## 2023-06-29 DIAGNOSIS — H2511 Age-related nuclear cataract, right eye: Secondary | ICD-10-CM | POA: Diagnosis not present

## 2023-06-29 DIAGNOSIS — H25811 Combined forms of age-related cataract, right eye: Secondary | ICD-10-CM | POA: Diagnosis not present

## 2023-06-29 DIAGNOSIS — H18511 Endothelial corneal dystrophy, right eye: Secondary | ICD-10-CM | POA: Diagnosis not present

## 2023-06-30 DIAGNOSIS — H18512 Endothelial corneal dystrophy, left eye: Secondary | ICD-10-CM | POA: Diagnosis not present

## 2023-06-30 DIAGNOSIS — Z9841 Cataract extraction status, right eye: Secondary | ICD-10-CM | POA: Diagnosis not present

## 2023-06-30 DIAGNOSIS — H25812 Combined forms of age-related cataract, left eye: Secondary | ICD-10-CM | POA: Diagnosis not present

## 2023-06-30 DIAGNOSIS — Z947 Corneal transplant status: Secondary | ICD-10-CM | POA: Diagnosis not present

## 2023-07-07 DIAGNOSIS — H25812 Combined forms of age-related cataract, left eye: Secondary | ICD-10-CM | POA: Diagnosis not present

## 2023-07-07 DIAGNOSIS — Z947 Corneal transplant status: Secondary | ICD-10-CM | POA: Diagnosis not present

## 2023-07-07 DIAGNOSIS — H18512 Endothelial corneal dystrophy, left eye: Secondary | ICD-10-CM | POA: Diagnosis not present

## 2023-07-22 DIAGNOSIS — H18512 Endothelial corneal dystrophy, left eye: Secondary | ICD-10-CM | POA: Diagnosis not present

## 2023-07-22 DIAGNOSIS — Z9841 Cataract extraction status, right eye: Secondary | ICD-10-CM | POA: Diagnosis not present

## 2023-07-22 DIAGNOSIS — H25812 Combined forms of age-related cataract, left eye: Secondary | ICD-10-CM | POA: Diagnosis not present

## 2023-07-22 DIAGNOSIS — Z947 Corneal transplant status: Secondary | ICD-10-CM | POA: Diagnosis not present

## 2023-08-09 DIAGNOSIS — Z01419 Encounter for gynecological examination (general) (routine) without abnormal findings: Secondary | ICD-10-CM | POA: Diagnosis not present

## 2023-08-18 DIAGNOSIS — H25812 Combined forms of age-related cataract, left eye: Secondary | ICD-10-CM | POA: Diagnosis not present

## 2023-08-18 DIAGNOSIS — Z947 Corneal transplant status: Secondary | ICD-10-CM | POA: Diagnosis not present

## 2023-08-18 DIAGNOSIS — Z9841 Cataract extraction status, right eye: Secondary | ICD-10-CM | POA: Diagnosis not present

## 2023-08-18 DIAGNOSIS — H18512 Endothelial corneal dystrophy, left eye: Secondary | ICD-10-CM | POA: Diagnosis not present

## 2023-09-06 DIAGNOSIS — E785 Hyperlipidemia, unspecified: Secondary | ICD-10-CM | POA: Diagnosis not present

## 2023-09-06 DIAGNOSIS — R7989 Other specified abnormal findings of blood chemistry: Secondary | ICD-10-CM | POA: Diagnosis not present

## 2023-09-06 DIAGNOSIS — E559 Vitamin D deficiency, unspecified: Secondary | ICD-10-CM | POA: Diagnosis not present

## 2023-09-06 DIAGNOSIS — R5383 Other fatigue: Secondary | ICD-10-CM | POA: Diagnosis not present

## 2023-09-12 DIAGNOSIS — N951 Menopausal and female climacteric states: Secondary | ICD-10-CM | POA: Diagnosis not present

## 2023-10-12 DIAGNOSIS — H2512 Age-related nuclear cataract, left eye: Secondary | ICD-10-CM | POA: Diagnosis not present

## 2023-10-12 DIAGNOSIS — H18512 Endothelial corneal dystrophy, left eye: Secondary | ICD-10-CM | POA: Diagnosis not present

## 2023-10-12 DIAGNOSIS — I341 Nonrheumatic mitral (valve) prolapse: Secondary | ICD-10-CM | POA: Diagnosis not present

## 2023-10-12 DIAGNOSIS — H25812 Combined forms of age-related cataract, left eye: Secondary | ICD-10-CM | POA: Diagnosis not present

## 2023-10-13 DIAGNOSIS — H18512 Endothelial corneal dystrophy, left eye: Secondary | ICD-10-CM | POA: Diagnosis not present

## 2023-10-13 DIAGNOSIS — Z947 Corneal transplant status: Secondary | ICD-10-CM | POA: Diagnosis not present

## 2023-10-13 DIAGNOSIS — H25812 Combined forms of age-related cataract, left eye: Secondary | ICD-10-CM | POA: Diagnosis not present

## 2023-10-13 DIAGNOSIS — Z9841 Cataract extraction status, right eye: Secondary | ICD-10-CM | POA: Diagnosis not present

## 2023-10-27 DIAGNOSIS — H53143 Visual discomfort, bilateral: Secondary | ICD-10-CM | POA: Diagnosis not present

## 2023-11-15 DIAGNOSIS — Z0000 Encounter for general adult medical examination without abnormal findings: Secondary | ICD-10-CM | POA: Diagnosis not present

## 2023-11-15 DIAGNOSIS — Z23 Encounter for immunization: Secondary | ICD-10-CM | POA: Diagnosis not present

## 2023-12-26 DIAGNOSIS — N951 Menopausal and female climacteric states: Secondary | ICD-10-CM | POA: Diagnosis not present

## 2023-12-29 DIAGNOSIS — R5383 Other fatigue: Secondary | ICD-10-CM | POA: Diagnosis not present

## 2023-12-29 DIAGNOSIS — I7090 Unspecified atherosclerosis: Secondary | ICD-10-CM | POA: Diagnosis not present

## 2023-12-29 DIAGNOSIS — R7989 Other specified abnormal findings of blood chemistry: Secondary | ICD-10-CM | POA: Diagnosis not present

## 2023-12-29 DIAGNOSIS — E785 Hyperlipidemia, unspecified: Secondary | ICD-10-CM | POA: Diagnosis not present

## 2024-01-16 DIAGNOSIS — H43811 Vitreous degeneration, right eye: Secondary | ICD-10-CM | POA: Diagnosis not present

## 2024-01-16 DIAGNOSIS — Z947 Corneal transplant status: Secondary | ICD-10-CM | POA: Diagnosis not present

## 2024-01-16 DIAGNOSIS — H18513 Endothelial corneal dystrophy, bilateral: Secondary | ICD-10-CM | POA: Diagnosis not present

## 2024-01-16 DIAGNOSIS — Z9841 Cataract extraction status, right eye: Secondary | ICD-10-CM | POA: Diagnosis not present

## 2024-02-10 DIAGNOSIS — Z23 Encounter for immunization: Secondary | ICD-10-CM | POA: Diagnosis not present

## 2024-03-20 DIAGNOSIS — Z947 Corneal transplant status: Secondary | ICD-10-CM | POA: Diagnosis not present

## 2024-03-20 DIAGNOSIS — Z9841 Cataract extraction status, right eye: Secondary | ICD-10-CM | POA: Diagnosis not present

## 2024-03-20 DIAGNOSIS — H43811 Vitreous degeneration, right eye: Secondary | ICD-10-CM | POA: Diagnosis not present

## 2024-03-20 DIAGNOSIS — H18513 Endothelial corneal dystrophy, bilateral: Secondary | ICD-10-CM | POA: Diagnosis not present

## 2024-05-07 DIAGNOSIS — R5383 Other fatigue: Secondary | ICD-10-CM | POA: Diagnosis not present

## 2024-05-07 DIAGNOSIS — R7989 Other specified abnormal findings of blood chemistry: Secondary | ICD-10-CM | POA: Diagnosis not present

## 2024-05-07 DIAGNOSIS — I7090 Unspecified atherosclerosis: Secondary | ICD-10-CM | POA: Diagnosis not present

## 2024-05-07 DIAGNOSIS — E785 Hyperlipidemia, unspecified: Secondary | ICD-10-CM | POA: Diagnosis not present

## 2024-05-07 DIAGNOSIS — E559 Vitamin D deficiency, unspecified: Secondary | ICD-10-CM | POA: Diagnosis not present

## 2024-05-08 ENCOUNTER — Other Ambulatory Visit: Payer: Self-pay | Admitting: Internal Medicine

## 2024-05-08 DIAGNOSIS — Z1231 Encounter for screening mammogram for malignant neoplasm of breast: Secondary | ICD-10-CM

## 2024-05-14 DIAGNOSIS — N951 Menopausal and female climacteric states: Secondary | ICD-10-CM | POA: Diagnosis not present

## 2024-06-06 ENCOUNTER — Ambulatory Visit
Admission: RE | Admit: 2024-06-06 | Discharge: 2024-06-06 | Disposition: A | Discharge status: Home / Self Care | Source: Ambulatory Visit | Attending: Internal Medicine | Admitting: Internal Medicine

## 2024-06-06 DIAGNOSIS — Z1231 Encounter for screening mammogram for malignant neoplasm of breast: Secondary | ICD-10-CM | POA: Diagnosis not present

## 2024-06-11 DIAGNOSIS — Z9842 Cataract extraction status, left eye: Secondary | ICD-10-CM | POA: Diagnosis not present

## 2024-06-11 DIAGNOSIS — Z9841 Cataract extraction status, right eye: Secondary | ICD-10-CM | POA: Diagnosis not present

## 2024-06-11 DIAGNOSIS — H43811 Vitreous degeneration, right eye: Secondary | ICD-10-CM | POA: Diagnosis not present

## 2024-06-11 DIAGNOSIS — Z961 Presence of intraocular lens: Secondary | ICD-10-CM | POA: Diagnosis not present
